# Patient Record
Sex: Female | Born: 1990 | Race: White | Hispanic: No | Marital: Single | Smoking: Never smoker
Health system: Southern US, Community
[De-identification: ages and names within clinical notes are randomized; demographics above are authoritative.]

## PROBLEM LIST (undated history)

## (undated) ENCOUNTER — Inpatient Hospital Stay (HOSPITAL_COMMUNITY): Payer: Self-pay

## (undated) DIAGNOSIS — F419 Anxiety disorder, unspecified: Secondary | ICD-10-CM

## (undated) DIAGNOSIS — O139 Gestational [pregnancy-induced] hypertension without significant proteinuria, unspecified trimester: Secondary | ICD-10-CM

## (undated) DIAGNOSIS — F32A Depression, unspecified: Secondary | ICD-10-CM

## (undated) DIAGNOSIS — F329 Major depressive disorder, single episode, unspecified: Secondary | ICD-10-CM

## (undated) DIAGNOSIS — Z87448 Personal history of other diseases of urinary system: Secondary | ICD-10-CM

## (undated) DIAGNOSIS — G43909 Migraine, unspecified, not intractable, without status migrainosus: Secondary | ICD-10-CM

## (undated) DIAGNOSIS — F988 Other specified behavioral and emotional disorders with onset usually occurring in childhood and adolescence: Secondary | ICD-10-CM

## (undated) DIAGNOSIS — E063 Autoimmune thyroiditis: Secondary | ICD-10-CM

## (undated) HISTORY — DX: Migraine, unspecified, not intractable, without status migrainosus: G43.909

## (undated) HISTORY — DX: Major depressive disorder, single episode, unspecified: F32.9

## (undated) HISTORY — DX: Depression, unspecified: F32.A

## (undated) HISTORY — PX: WISDOM TOOTH EXTRACTION: SHX21

## (undated) HISTORY — DX: Personal history of other diseases of urinary system: Z87.448

## (undated) HISTORY — DX: Gestational (pregnancy-induced) hypertension without significant proteinuria, unspecified trimester: O13.9

## (undated) HISTORY — DX: Anxiety disorder, unspecified: F41.9

## (undated) HISTORY — PX: TYMPANOSTOMY TUBE PLACEMENT: SHX32

## (undated) HISTORY — PX: TONSILLECTOMY AND ADENOIDECTOMY: SHX28

## (undated) HISTORY — DX: Other specified behavioral and emotional disorders with onset usually occurring in childhood and adolescence: F98.8

---
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
## 2012-07-25 ENCOUNTER — Ambulatory Visit: Payer: Self-pay | Admitting: Obstetrics and Gynecology

## 2012-07-25 ENCOUNTER — Encounter: Payer: Self-pay | Admitting: Obstetrics and Gynecology

## 2012-07-25 VITALS — BP 128/86 | Ht 70.0 in | Wt 236.0 lb

## 2012-07-25 DIAGNOSIS — N6489 Other specified disorders of breast: Secondary | ICD-10-CM

## 2012-07-25 NOTE — Progress Notes (Signed)
Amanda Phelps  is a 21 y.o. female who presents for evaluate for changes in breast size.    HPI: Amanda Phelps is a 21 yo who evaluate for breast size.  She is very self-conscious about the asymmetry of her breasts.  She would like to see what options are available for treatment.  She is otherwise UTD on her annual health care.    OB History    Grav Para Term Preterm Abortions TAB SAB Ect Mult Living    0 0 0 0 0 0 0 0 0 0        Obstetric Comments    Menarche: 21 yo; Coitarche: 21 yo  LMP 06/30/2012.   Menses:  regular every 28-30 days  Bleeding: 5-6  Cramps: None  Patient is sexually active.   Safe Sex:  yes   STD Hx: none  Contraception: condoms: 100% and OCP  Pap smear: never          Screening History:  Immunizations UTD:  yes.  Gardasil: yes.  Lipids: N/A  Glucose: N/A  Thyroid Screening:  N/A    Past Medical History   Diagnosis Date   . ADD (attention deficit disorder)      not formerly diagnosed   . Anxiety disorder        Past Surgical History   Procedure Laterality Date   . Tonsillectomy and adenoidectomy         Family History   Problem Relation Age of Onset   . Cancer Maternal Grandfather      Lung cancer, smoker   . Heart failure Maternal Grandmother    . Heart failure Maternal Grandfather    . Osteoporosis Maternal Aunt    . Thyroid disease Maternal Grandfather    . Diabetes Maternal Grandmother    . Depression Maternal Aunt        HEALTH HABITS:    Guns in the home: No  Wear a helmet when riding a bike:  Yes 100%  Wears seat belt in the car: yes  Internet safety:  yes  Phone safety (texting and driving):  no  The patient participates in regular exercise: yes  Type:  Work out 3-5 times per week  Has the patient ever been transfused or tattooed?:yes  Self Breast Awareness:  yes  Vitamin D3: yes  Three servings of calcium per day:   yes  Balanced Diet:  yes  Dental visit in last 6 months:  yes  Eye exams yearly:  yes  Sunscreen use when in sun > 20 min:  yes  Health Care Proxy:  yes    Allergies   Allergen  Reactions   . Bactrim (Sulfamethoxazole W-Trimethoprim) Rash   . Zithromax (Azithromycin) Rash        Current Outpatient Prescriptions   Medication   . amphetamine-dextroamphetamine (ADDERALL) 15 MG tablet   . Norgestim-Eth Estrad Triphasic (ORTHO TRI-CYCLEN, 28, PO)     No current facility-administered medications for this visit.        History     Social History   . Marital Status: Single     Spouse Name: N/A     Number of Children: N/A   . Years of Education: N/A     Occupational History   . Not on file.     Social History Main Topics   . Smoking status: Never Smoker    . Smokeless tobacco: Never Used   . Alcohol Use: Yes      Comment: socially    .  Drug Use: No   . Sexually Active: Yes -- Female partner(s)     Birth Control/ Protection: Condom, OCP     Other Topics Concern   . Not on file     Social History Narrative    Psychosocial Evaluation:        Lives with roommate and parents    Abuse: No    Depression symptoms:  no    School:  [employer] [place]    Patient is doing well in school:  yes    Grades:  [employer]    Behavioral or learning problems:  no    Peer relationships:  Good friends and boyfriend treats her well.    Family relationships:  Good    Future goals:  Marketing rep                  ROS:  CONSTITUTIONAL: Appetite good, no fevers, night sweats or weight loss  EYES: No visual changes, no eye pain  ENT: No hearing difficulties, no ear pain  CV: No chest pain, shortness of breath or peripheral edema  RESPIRATORY: No cough, wheezing or dyspnea  GI: No nausea/vomiting, abdominal pain, or change in bowel habits  GU: No dysuria, urgency or incontinence  MS: No joint pain/swelling or musculoskeletal deformities  SKIN: No rashes  NEURO: No MS changes, no motor weakness, no sensory changes  PSYCH: No depression or anxiety  ENDOCRINE: No polyuria/polydipsia, no heat intolerance  HEME/LYMPH: No easy bleeding/bruising or swollen nodes  ALL/IMMUN: No allergic reactions     OBJECTIVE:  BP 128/86  Ht 5\' 10"   (1.778 m)  Wt 236 lb (107.049 kg)  BMI 33.86 kg/m2  LMP 06/30/2012   Physical Exam:  GENERAL: 21 y.o.  year y/o female in NAD.  HEENT: EOMI  LUNGS: respirations unlabored.  HEART: Regular rate  BREASTS: No palpable masses or nipple discharge. No skin changes. No axillary or clavicular adenopathy.  Left breast is larger than the right breast.  Tanner stage:  5  PELVIC: Not indicated  EXTREMITIES: no edema.  SKIN: No rashes, no evidence of skin breakdown, no suspicious nevi.  MENTAL STATUS: Alert, normal MS. Answers all questions appropriately.    Assessment:  Breast Asymetry  Plan:  GYN SCREENING:      []   Pap smear sent      []   High Risk HPV sent     [x]   Pap smear starting at age 75     []   GC     []  Chlamydia     []   HIV     []   RPR     []   HSV 2 serology      []   Hepatitis C      []   Hepatitis B surface antibody and antigen     [x]   STI screening with sexual activity     PREVENTATIVE HEALTH:     []   Healthy women's lifestyle handout      [x]   Self breast awareness      [x]   Nutrition      [x]   Calcium and Vitamin D3     [x]   Multivitamins      [x]   Screening blood work up to date     []   Screening blood work ordered:  []   Lipids []   A1C []   Thyroid Function Tests    SMOKING CESSATION:       [x]   N/A      []   Risks reviewed      []   Offered nicotine replacement []   Buspar []   Chantix []   Zyban    SAFE SEX PRACTICES:  Reviewed    VACCINATIONS: UTD      Breast asymmetry.  Discussed that other than padding the right breast, surgery would be her most permanent option.  She could elect for reduction on the left vs augmentation on the right.  Discussed that if it is a cosmetic surgery, then insurance may not cover the procedure.  Discussed that no medical problem appears to be present or the cause of the asymmetry.    RETURN TO CLINIC:  As needed.

## 2013-01-19 ENCOUNTER — Emergency Department (INDEPENDENT_AMBULATORY_CARE_PROVIDER_SITE_OTHER)
Admission: EM | Admit: 2013-01-19 | Discharge: 2013-01-19 | Disposition: A | Discharge status: Home / Self Care | Payer: 59 | Source: Home / Self Care | Attending: Emergency Medicine | Admitting: Emergency Medicine

## 2013-01-19 ENCOUNTER — Encounter (HOSPITAL_COMMUNITY): Payer: Self-pay | Admitting: *Deleted

## 2013-01-19 DIAGNOSIS — M549 Dorsalgia, unspecified: Secondary | ICD-10-CM

## 2013-01-19 LAB — POCT URINALYSIS DIP (DEVICE)
Hgb urine dipstick: NEGATIVE
Protein, ur: 30 mg/dL — AB
Specific Gravity, Urine: >=1.03 (ref 1.005–1.030)
Urobilinogen, UA: 0.2 mg/dL (ref 0.0–1.0)

## 2013-01-19 LAB — POCT PREGNANCY, URINE: Preg Test, Ur: NEGATIVE

## 2013-01-19 MED ORDER — CYCLOBENZAPRINE HCL 10 MG PO TABS: 10.0000 mg | ORAL_TABLET | Freq: Two times a day (BID) | ORAL | Status: DC | PRN

## 2013-01-19 MED ORDER — HYDROCODONE-IBUPROFEN 7.5-200 MG PO TABS: 1.0000 | ORAL_TABLET | Freq: Three times a day (TID) | ORAL | Status: DC | PRN

## 2013-01-19 NOTE — ED Provider Notes (Signed)
History     CSN: [id number]  Arrival date & time 01/19/13  1647   First MD Initiated Contact with Patient 01/19/13 1707      Chief Complaint  Patient presents with  . Back Pain    (Consider location/radiation/quality/duration/timing/severity/associated sxs/prior treatment) HPI Comments: Patient presents urgent care complaining of lower back pain for the last 2-3 days. Describes the pain as told and sharp with movements and it worsens when she lays flat. If she stays still she feels significant improvement. Patient denies any further associated symptoms such as urinary symptoms that includes decrease frequency pressure burning with urination. Patient also denies any pelvic or abdominal pain. No fevers. Patient denies any lower extremity weakness or paresthesias. Denies any recent trauma or injury. No fevers. Describe some ibuprofen but did not help much. She decided to come in and somebody told her that she could be having a kidney infection. Decided to come in to be checked and somebody mentioned that she could be expressing a kidney infection. She denies any urinary symptoms or pelvic pain or flank pain nausea vomiting or fevers,   Patient is a 22 y.o. female presenting with back pain. The history is provided by the patient.  Back Pain Location:  Lumbar spine Quality:  Aching Radiates to:  Does not radiate Pain severity:  Moderate Pain is:  Same all the time Onset quality:  Gradual Duration:  3 days Timing:  Constant Chronicity:  New Context: not lifting heavy objects, not recent illness and not recent injury   Relieved by:  Nothing Worsened by:  Movement, standing and ambulation Ineffective treatments:  Ibuprofen Associated symptoms: no abdominal pain, no abdominal swelling, no bladder incontinence, no bowel incontinence, no dysuria, no fever, no numbness, no paresthesias, no pelvic pain, no perianal numbness, no tingling and no weakness   Risk factors: no recent surgery      History reviewed. No pertinent past medical history.  History reviewed. No pertinent past surgical history.  History reviewed. No pertinent family history.  History  Substance Use Topics  . Smoking status: Never Smoker   . Smokeless tobacco: Not on file  . Alcohol Use: Yes    OB History   Grav Para Term Preterm Abortions TAB SAB Ect Mult Living                  Review of Systems  Constitutional: Negative for fever, chills, diaphoresis and appetite change.  Gastrointestinal: Negative for abdominal pain and bowel incontinence.  Genitourinary: Negative for bladder incontinence, dysuria, frequency, flank pain, difficulty urinating and pelvic pain.  Musculoskeletal: Positive for back pain. Negative for myalgias, joint swelling and arthralgias.  Skin: Negative for color change, pallor and rash.  Neurological: Negative for tingling, weakness, numbness and paresthesias.    Allergies  Bactrim and Zithromax  Home Medications   Current Outpatient Rx  Name  Route  Sig  Dispense  Refill  . cyclobenzaprine (FLEXERIL) 10 MG tablet   Oral   Take 1 tablet (10 mg total) by mouth 2 (two) times daily as needed for muscle spasms.   10 tablet   0   . HYDROcodone-ibuprofen (VICOPROFEN) 7.5-200 MG per tablet   Oral   Take 1 tablet by mouth every 8 (eight) hours as needed for pain.   15 tablet   0     BP 126/83  Pulse 95  Temp(Src) 97.9 F (36.6 C) (Oral)  Resp 18  SpO2 98%  LMP 01/11/2013  Physical Exam  Nursing note and  vitals reviewed. Constitutional: She appears well-developed and well-nourished.  Pulmonary/Chest: Effort normal and breath sounds normal.  Abdominal: Soft.  Musculoskeletal: She exhibits tenderness.       Lumbar back: She exhibits decreased range of motion, tenderness and pain. She exhibits no bony tenderness, no swelling, no edema, no deformity, no laceration, no spasm and normal pulse.       Back:  Neurological: She is alert.  Skin: No rash noted.  No erythema.    ED Course  Procedures (including critical care time)  Labs Reviewed  POCT URINALYSIS DIP (DEVICE) - Abnormal; Notable for the following:    Bilirubin Urine SMALL (*)    Protein, ur 30 (*)    All other components within normal limits  POCT PREGNANCY, URINE   No results found.   1. Back pain       MDM  Symptoms and exam is most suggestive of paravertebral lumbar muscle pain. Rx for Vicoprofen and Flexeril- discussed symptoms that should warrant further evaluation.        Jimmie Molly, MD 01/19/13 [phone]

## 2013-01-19 NOTE — ED Notes (Signed)
Pt  Reports  Low  Back  Pain  X  Several  Days  Worse  On  Movement  And  posistion      Denys  Any  Urinary  Symptoms       denys  Any  specefic   Injury   Walks  Upright  With a  Brisk  Gait

## 2013-06-23 ENCOUNTER — Encounter (HOSPITAL_COMMUNITY): Payer: Self-pay | Admitting: Emergency Medicine

## 2013-06-23 ENCOUNTER — Emergency Department (INDEPENDENT_AMBULATORY_CARE_PROVIDER_SITE_OTHER)
Admission: EM | Admit: 2013-06-23 | Discharge: 2013-06-23 | Disposition: A | Discharge status: Home / Self Care | Payer: PRIVATE HEALTH INSURANCE | Source: Home / Self Care | Attending: Family Medicine | Admitting: Family Medicine

## 2013-06-23 DIAGNOSIS — S069X0A Unspecified intracranial injury without loss of consciousness, initial encounter: Secondary | ICD-10-CM

## 2013-06-23 DIAGNOSIS — S069X9A Unspecified intracranial injury with loss of consciousness of unspecified duration, initial encounter: Secondary | ICD-10-CM

## 2013-06-23 MED ORDER — ONDANSETRON 4 MG PO TBDP: 4.0000 mg | ORAL_TABLET | Freq: Four times a day (QID) | ORAL | Status: DC | PRN

## 2013-06-23 MED ORDER — TRAMADOL HCL 50 MG PO TABS: 50.0000 mg | ORAL_TABLET | Freq: Four times a day (QID) | ORAL | Status: DC | PRN

## 2013-06-23 MED ORDER — BUTALBITAL-APAP-CAFFEINE 50-325-40 MG PO TABS: 1.0000 | ORAL_TABLET | Freq: Four times a day (QID) | ORAL | Status: AC | PRN

## 2013-06-23 NOTE — ED Provider Notes (Signed)
Medical screening examination/treatment/procedure(s) were performed by resident physician or non-physician practitioner and as supervising physician I was immediately available for consultation/collaboration.   KINDL,JAMES DOUGLAS MD.   James D Kindl, MD 06/23/13 1701 

## 2013-06-23 NOTE — ED Provider Notes (Signed)
CSN: [id number]     Arrival date & time 06/23/13  1325 History   First MD Initiated Contact with Patient 06/23/13 1534     Chief Complaint  Patient presents with  . Headache   (Consider location/radiation/quality/duration/timing/severity/associated sxs/prior Treatment) HPI Comments: 22 year old female presents complaining of left-sided headache since yesterday. Yesterday, she rolled off the side of her bed and hit the left side of her head on her nightstand. She had immediate pain in the left side of her head. Since then, she has developed some dizziness and nausea. She has not actually vomited. She did not lose consciousness. Headache has not been increasing since it began.  Patient is a 22 y.o. female presenting with headaches.  Headache Associated symptoms: dizziness, nausea and photophobia   Associated symptoms: no abdominal pain, no cough, no fever, no myalgias and no vomiting     History reviewed. No pertinent past medical history. History reviewed. No pertinent past surgical history. No family history on file. History  Substance Use Topics  . Smoking status: Never Smoker   . Smokeless tobacco: Not on file  . Alcohol Use: Yes   OB History   Grav Para Term Preterm Abortions TAB SAB Ect Mult Living                 Review of Systems  Constitutional: Negative for fever and chills.  Eyes: Positive for photophobia and visual disturbance.  Respiratory: Negative for cough and shortness of breath.   Cardiovascular: Negative for chest pain, palpitations and leg swelling.  Gastrointestinal: Positive for nausea. Negative for vomiting and abdominal pain.  Endocrine: Negative for polydipsia and polyuria.  Genitourinary: Negative for dysuria, urgency and frequency.  Musculoskeletal: Negative for arthralgias and myalgias.  Skin: Negative for rash.  Neurological: Positive for dizziness and headaches. Negative for weakness and light-headedness.    Allergies  Bactrim and  Zithromax  Home Medications   Current Outpatient Rx  Name  Route  Sig  Dispense  Refill  . butalbital-acetaminophen-caffeine (FIORICET) 50-325-40 MG per tablet   Oral   Take 1-2 tablets by mouth every 6 (six) hours as needed for headache.   20 tablet   0   . cyclobenzaprine (FLEXERIL) 10 MG tablet   Oral   Take 1 tablet (10 mg total) by mouth 2 (two) times daily as needed for muscle spasms.   10 tablet   0   . HYDROcodone-ibuprofen (VICOPROFEN) 7.5-200 MG per tablet   Oral   Take 1 tablet by mouth every 8 (eight) hours as needed for pain.   15 tablet   0   . ondansetron (ZOFRAN-ODT) 4 MG disintegrating tablet   Oral   Take 1 tablet (4 mg total) by mouth every 6 (six) hours as needed for nausea. PRN for nausea or vomiting   12 tablet   0   . traMADol (ULTRAM) 50 MG tablet   Oral   Take 1 tablet (50 mg total) by mouth every 6 (six) hours as needed for severe pain.   15 tablet   0    BP 137/95  Pulse 95  Temp(Src) 98.6 F (37 C) (Oral)  Resp 18  SpO2 97%  LMP 05/30/2013 Physical Exam  Nursing note and vitals reviewed. Constitutional: She is oriented to person, place, and time. Vital signs are normal. She appears well-developed and well-nourished. No distress.  HENT:  Head: Normocephalic and atraumatic.  Eyes: Conjunctivae and EOM are normal. Pupils are equal, round, and reactive to light.  Neck: Normal  range of motion. Neck supple. No JVD present.  Pulmonary/Chest: Effort normal. No respiratory distress.  Neurological: She is alert and oriented to person, place, and time. She has normal strength. She displays normal reflexes. No cranial nerve deficit or sensory deficit. She exhibits normal muscle tone. Coordination and gait normal.  Skin: Skin is warm and dry. No rash noted. She is not diaphoretic.  Psychiatric: She has a normal mood and affect. Judgment normal.    ED Course  Procedures (including critical care time) Labs Review Labs Reviewed - No data to  display Imaging Review No results found.    MDM   1. TBI (traumatic brain injury), initial encounter    Neuro exam is normal. Symptoms are not worsening, this is a concussion. Treating symptomatically. Discussed return to full activity protocol. Followup if worsening in the emergency department.       Graylon Good, PA-C 06/23/13 1616

## 2013-06-23 NOTE — ED Notes (Signed)
Pt c/o headache onset yest night on left side Reports she was playing and rolled off her bed on to the corner of the wooden nightstand Has a small bump on left side and it hurts... Pain is constant and increases w/bright light... Also feeling nauseas and having blurry vision Denies: LOC, abn bleeding Alert w/no signs of acute distress.

## 2014-08-28 ENCOUNTER — Emergency Department (HOSPITAL_COMMUNITY)
Admission: EM | Admit: 2014-08-28 | Discharge: 2014-08-29 | Disposition: A | Discharge status: Home / Self Care | Payer: Medicaid - Out of State | Attending: Emergency Medicine | Admitting: Emergency Medicine

## 2014-08-28 ENCOUNTER — Encounter (HOSPITAL_COMMUNITY): Payer: Self-pay | Admitting: Emergency Medicine

## 2014-08-28 DIAGNOSIS — K219 Gastro-esophageal reflux disease without esophagitis: Secondary | ICD-10-CM | POA: Insufficient documentation

## 2014-08-28 DIAGNOSIS — K852 Alcohol induced acute pancreatitis without necrosis or infection: Secondary | ICD-10-CM

## 2014-08-28 DIAGNOSIS — R1012 Left upper quadrant pain: Secondary | ICD-10-CM

## 2014-08-28 DIAGNOSIS — Z3202 Encounter for pregnancy test, result negative: Secondary | ICD-10-CM | POA: Diagnosis not present

## 2014-08-28 LAB — COMPREHENSIVE METABOLIC PANEL
ALT: 31 U/L (ref 0–35)
ANION GAP: 8 (ref 5–15)
AST: 28 U/L (ref 0–37)
Albumin: 4.2 g/dL (ref 3.5–5.2)
Alkaline Phosphatase: 62 U/L (ref 39–117)
BILIRUBIN TOTAL: 0.5 mg/dL (ref 0.3–1.2)
BUN: 8 mg/dL (ref 6–23)
CO2: 24 mmol/L (ref 19–32)
Calcium: 8.9 mg/dL (ref 8.4–10.5)
Chloride: 105 mmol/L (ref 96–112)
Creatinine, Ser: 0.59 mg/dL (ref 0.50–1.10)
GFR calc Af Amer: >90 mL/min (ref >90)
GFR calc non Af Amer: >90 mL/min (ref >90)
GLUCOSE: 114 mg/dL — AB (ref 70–99)
Potassium: 3.5 mmol/L (ref 3.5–5.1)
Sodium: 137 mmol/L (ref 135–145)
Total Protein: 7.4 g/dL (ref 6.0–8.3)

## 2014-08-28 LAB — URINALYSIS, ROUTINE W REFLEX MICROSCOPIC
Bilirubin Urine: NEGATIVE
GLUCOSE, UA: NEGATIVE mg/dL
Hgb urine dipstick: NEGATIVE
Ketones, ur: NEGATIVE mg/dL
LEUKOCYTES UA: NEGATIVE
NITRITE: NEGATIVE
PH: 6.5 (ref 5.0–8.0)
Protein, ur: NEGATIVE mg/dL
SPECIFIC GRAVITY, URINE: 1.006 (ref 1.005–1.030)
Urobilinogen, UA: 0.2 mg/dL (ref 0.0–1.0)

## 2014-08-28 LAB — CBC WITH DIFFERENTIAL/PLATELET
BASOS ABS: 0 10*3/uL (ref 0.0–0.1)
BASOS PCT: 0 % (ref 0–1)
Eosinophils Absolute: 0.1 10*3/uL (ref 0.0–0.7)
Eosinophils Relative: 1 % (ref 0–5)
HEMATOCRIT: 38.6 % (ref 36.0–46.0)
HEMOGLOBIN: 13.4 g/dL (ref 12.0–15.0)
Lymphocytes Relative: 26 % (ref 12–46)
Lymphs Abs: 2.4 10*3/uL (ref 0.7–4.0)
MCH: 30.5 pg (ref 26.0–34.0)
MCHC: 34.7 g/dL (ref 30.0–36.0)
MCV: 87.7 fL (ref 78.0–100.0)
Monocytes Absolute: 0.7 10*3/uL (ref 0.1–1.0)
Monocytes Relative: 8 % (ref 3–12)
Neutro Abs: 5.9 10*3/uL (ref 1.7–7.7)
Neutrophils Relative %: 65 % (ref 43–77)
PLATELETS: 236 10*3/uL (ref 150–400)
RBC: 4.4 MIL/uL (ref 3.87–5.11)
RDW: 12.7 % (ref 11.5–15.5)
WBC: 9.1 10*3/uL (ref 4.0–10.5)

## 2014-08-28 LAB — LIPASE, BLOOD: Lipase: 67 U/L — ABNORMAL HIGH (ref 11–59)

## 2014-08-28 LAB — POC URINE PREG, ED: Preg Test, Ur: NEGATIVE

## 2014-08-28 MED ORDER — SODIUM CHLORIDE 0.9 % IV BOLUS (SEPSIS)
1000.0000 mL | Freq: Once | INTRAVENOUS | Status: AC
  Administered 2014-08-28: 1000 mL via INTRAVENOUS

## 2014-08-28 MED ORDER — GI COCKTAIL ~~LOC~~
30.0000 mL | Freq: Once | ORAL | Status: AC
  Administered 2014-08-28: 30 mL via ORAL
  Filled 2014-08-28: qty 30

## 2014-08-28 MED ORDER — PANTOPRAZOLE SODIUM 40 MG IV SOLR
40.0000 mg | Freq: Once | INTRAVENOUS | Status: AC
  Administered 2014-08-28: 40 mg via INTRAVENOUS
  Filled 2014-08-28: qty 40

## 2014-08-28 NOTE — ED Provider Notes (Signed)
CSN: [id number]     Arrival date & time 08/28/14  1759 History   First MD Initiated Contact with Patient 08/28/14 2231     Chief Complaint  Patient presents with  . Flank Pain    pain in upper l/abdomin radiating to l/back     (Consider location/radiation/quality/duration/timing/severity/associated sxs/prior Treatment) HPI Comments: Beth English is a 24 y.o. female with no significant PMHx, who presents to the ED with complaints of LUQ/L lateral abdominal pain that began gradually on Saturday after consuming a large amount of alcohol on Friday night and eating "junk food". Patient reports that during the snowstorm, she and her friends consumed 2 bottles of wine, 2 beers, and 2 gin-and-juice cocktails, and on Saturday morning she developed 8/10 sharp constant nonradiating pain located in the left upper quadrant and lateral aspect of the abdomen, pointing to the mid axillary line at the bottom of the rib cage, which worsens with stretching and movement, and is unrelieved with heat and ibuprofen. She endorses associated reflux and belching. She denies any fevers, chills, chest pain, shortness of breath, nausea, vomiting, diarrhea, constipation, melena, hematochezia, obstipation, dysuria, hematuria, vaginal bleeding or discharge, recent travel, antibiotics, or sick contacts. She does endorse taking NSAIDs quite frequently. She is sexually active with one female partner, unprotected. Last menstrual period was 07/26/14, and she is currently in between birth control pills and therefore her period is irregular this month.  Patient is a 24 y.o. female presenting with abdominal pain. The history is provided by the patient. No language interpreter was used.  Abdominal Pain Pain location:  LUQ Pain quality: sharp   Pain radiates to:  Does not radiate Pain severity:  Moderate (8/10) Onset quality:  Gradual Duration:  4 days Timing:  Constant Progression:  Unchanged Chronicity:  New Context: alcohol use     Relieved by:  Nothing Worsened by:  Movement Ineffective treatments:  Heat and NSAIDs Associated symptoms: belching   Associated symptoms: no chest pain, no chills, no constipation, no diarrhea, no dysuria, no fever, no flatus, no hematemesis, no hematochezia, no hematuria, no melena, no nausea, no shortness of breath, no vaginal bleeding, no vaginal discharge and no vomiting   Risk factors: NSAID use     History reviewed. No pertinent past medical history. Past Surgical History  Procedure Laterality Date  . Tonsillectomy    . Tympanostomy tube placement    . Wisdom tooth extraction     Family History  Problem Relation Age of Onset  . Hypertension Mother   . Diabetes Other    History  Substance Use Topics  . Smoking status: Never Smoker   . Smokeless tobacco: Not on file  . Alcohol Use: Yes   OB History    No data available     Review of Systems  Constitutional: Negative for fever and chills.  Respiratory: Negative for shortness of breath.   Cardiovascular: Negative for chest pain.  Gastrointestinal: Positive for abdominal pain. Negative for nausea, vomiting, diarrhea, constipation, blood in stool, melena, hematochezia, flatus and hematemesis.       +reflux  Genitourinary: Positive for flank pain. Negative for dysuria, urgency, hematuria, vaginal bleeding, vaginal discharge and menstrual problem.  Musculoskeletal: Negative for myalgias, back pain and arthralgias.  Skin: Negative for color change.  Allergic/Immunologic: Negative for immunocompromised state.  Neurological: Negative for weakness and numbness.   10 Systems reviewed and are negative for acute change except as noted in the HPI.    Allergies  Bactrim and Zithromax  Home  Medications   Prior to Admission medications   Medication Sig Start Date End Date Taking? Authorizing Provider  cyclobenzaprine (FLEXERIL) 10 MG tablet Take 1 tablet (10 mg total) by mouth 2 (two) times daily as needed for muscle spasms.  01/19/13   Jimmie Molly, MD  HYDROcodone-ibuprofen (VICOPROFEN) 7.5-200 MG per tablet Take 1 tablet by mouth every 8 (eight) hours as needed for pain. 01/19/13   Jimmie Molly, MD  ondansetron (ZOFRAN-ODT) 4 MG disintegrating tablet Take 1 tablet (4 mg total) by mouth every 6 (six) hours as needed for nausea. PRN for nausea or vomiting 06/23/13   Graylon Good, PA-C  traMADol (ULTRAM) 50 MG tablet Take 1 tablet (50 mg total) by mouth every 6 (six) hours as needed for severe pain. 06/23/13   Adrian Blackwater Baker, PA-C   BP 125/89 mmHg  Pulse 90  Temp(Src) 98.2 F (36.8 C) (Oral)  Resp 18  Wt 245 lb (111.131 kg)  SpO2 100%  LMP 07/26/2014 (Exact Date) Physical Exam  Constitutional: She is oriented to person, place, and time. Vital signs are normal. She appears well-developed and well-nourished.  Non-toxic appearance. No distress.  Afebrile, nontoxic, NAD  HENT:  Head: Normocephalic and atraumatic.  Mouth/Throat: Oropharynx is clear and moist and mucous membranes are normal.  Eyes: Conjunctivae and EOM are normal. Right eye exhibits no discharge. Left eye exhibits no discharge.  Neck: Normal range of motion. Neck supple.  Cardiovascular: Normal rate, regular rhythm, normal heart sounds and intact distal pulses.  Exam reveals no gallop and no friction rub.   No murmur heard. Pulmonary/Chest: Effort normal and breath sounds normal. No respiratory distress. She has no decreased breath sounds. She has no wheezes. She has no rhonchi. She has no rales.  Abdominal: Soft. Normal appearance and bowel sounds are normal. She exhibits no distension. There is tenderness in the left upper quadrant. There is no rigidity, no rebound, no guarding, no CVA tenderness, no tenderness at McBurney's point and negative Murphy's sign.    Soft, ND, +BS throughout, with LUQ/L lateral abd tenderness extending along inferior border of rib cage around to mid-axillary line, no r/g/r, neg murphy's, neg mcburney's, no CVA TTP     Musculoskeletal: Normal range of motion.  Neurological: She is alert and oriented to person, place, and time. She has normal strength. No sensory deficit.  Skin: Skin is warm, dry and intact. No rash noted.  Psychiatric: She has a normal mood and affect.  Nursing note and vitals reviewed.   ED Course  Procedures (including critical care time) Labs Review Labs Reviewed  URINALYSIS, ROUTINE W REFLEX MICROSCOPIC - Abnormal; Notable for the following:    APPearance CLOUDY (*)    All other components within normal limits  COMPREHENSIVE METABOLIC PANEL - Abnormal; Notable for the following:    Glucose, Bld 114 (*)    All other components within normal limits  LIPASE, BLOOD - Abnormal; Notable for the following:    Lipase 67 (*)    All other components within normal limits  CBC WITH DIFFERENTIAL/PLATELET  POC URINE PREG, ED    Imaging Review No results found.   EKG Interpretation None      MDM   Final diagnoses:  LUQ abdominal pain  Alcohol-induced acute pancreatitis  Gastroesophageal reflux disease, esophagitis presence not specified    24 y.o. female with LUQ/L lateral abd pain after consuming alcohol 4 days ago. +NSAID use. +reflux and belching. Nonperitoneal abd exam, doubt need for imaging. Upreg neg. U/A clear.  Will get labs, give protonix and GI cocktail, and fluids, and reassess. Will consider morphine if pain persists but pt is driving therefore would like to avoid narcotics here. DDx includes gastritis vs pancreatitis. Will reassess shortly.   12:37 AM  CBC WNL. CMP WNL. Lipase 67, which is likely from pancreatitis that is resolving since onset of symptoms was 4 days ago and level is almost WNL. Pt without any LFT abnormalities or murphy's, doubt need for RUQ u/s today since this was likely related to EtOH consumption. Reflux symptom improved after GI cocktail, but pain still somewhat present. Discussed morphine, which she initially agreed to but then realized her  phone was almost out of battery and stated she preferred to be discharged home with pain medicine and she will fill it and take when she gets home. Will give her norco and prilosec. Will have her avoid EtOH and NSAIDs. Discussed f/up with campus clinic in 2 days for recheck. Discussed diet modifications to help avoid any worsening. I explained the diagnosis and have given explicit precautions to return to the ER including for any other new or worsening symptoms. The patient understands and accepts the medical plan as it's been dictated and I have answered their questions. Discharge instructions concerning home care and prescriptions have been given. The patient is STABLE and is discharged to home in good condition.  BP 129/72 mmHg  Pulse 82  Temp(Src) 98.1 F (36.7 C) (Oral)  Resp 18  Wt 245 lb (111.131 kg)  SpO2 100%  LMP 07/26/2014 (Exact Date)  Meds ordered this encounter  Medications  . gi cocktail (Maalox,Lidocaine,Donnatal)    Sig:   . pantoprazole (PROTONIX) injection 40 mg    Sig:   . sodium chloride 0.9 % bolus 1,000 mL    Sig:   . DISCONTD: morphine 4 MG/ML injection 4 mg    Sig:   . omeprazole (PRILOSEC) 20 MG capsule    Sig: Take 1 capsule (20 mg total) by mouth daily.    Dispense:  30 capsule    Refill:  0  . HYDROcodone-acetaminophen (NORCO) 5-325 MG per tablet    Sig: Take 1 tablet by mouth every 6 (six) hours as needed for severe pain.    Dispense:  10 tablet    Refill:  [address]     Theresia Pree Strupp Jermynamprubi-Soms, PA-C 08/29/14 0044  Linwood DibblesJon Knapp, MD 08/30/14 [phone]

## 2014-08-28 NOTE — ED Notes (Signed)
Pt reports a 4 days hx of increased l/flank pain. Denies burning on urination or discolored urine.Denies NVD

## 2014-08-29 MED ORDER — OMEPRAZOLE 20 MG PO CPDR: 20.0000 mg | DELAYED_RELEASE_CAPSULE | Freq: Every day | ORAL | Status: DC

## 2014-08-29 MED ORDER — HYDROCODONE-ACETAMINOPHEN 5-325 MG PO TABS: 1.0000 | ORAL_TABLET | Freq: Four times a day (QID) | ORAL | Status: DC | PRN

## 2014-08-29 MED ORDER — MORPHINE SULFATE 4 MG/ML IJ SOLN: 4.0000 mg | Freq: Once | INTRAMUSCULAR | Status: DC

## 2014-08-29 NOTE — Discharge Instructions (Signed)
Your symptoms are likely from alcohol induced pancreatitis, which seems to be resolving. You could have also caused some gastritis from the alcohol. Use prilosec as directed to help protect your stomach and help soothe your symptoms. Use norco as directed as needed for pain. Don't drive while taking norco. Avoid alcohol or ibuprofen/NSAID use to help avoid worsening of symptoms. Follow the diet modifications discussed below to help with your symptoms, sticking to a bland low fat diet. See your campus clinic in 2 days for recheck. Return to the ER for changes or worsening symptoms.  Abdominal (belly) pain can be caused by many things. Your caregiver performed an examination and possibly ordered blood/urine tests and imaging (CT scan, x-rays, ultrasound). Many cases can be observed and treated at home after initial evaluation in the emergency department. Even though you are being discharged home, abdominal pain can be unpredictable. Therefore, you need a repeated exam if your pain does not resolve, returns, or worsens. Most patients with abdominal pain don't have to be admitted to the hospital or have surgery, but serious problems like appendicitis and gallbladder attacks can start out as nonspecific pain. Many abdominal conditions cannot be diagnosed in one visit, so follow-up evaluations are very important. SEEK IMMEDIATE MEDICAL ATTENTION IF YOU DEVELOP ANY OF THE FOLLOWING SYMPTOMS:  The pain does not go away or becomes severe.   A temperature above 101 develops.   Repeated vomiting occurs (multiple episodes).   The pain becomes localized to portions of the abdomen. The right side could possibly be appendicitis. In an adult, the left lower portion of the abdomen could be colitis or diverticulitis.   Blood is being passed in stools or vomit (bright red or black tarry stools).   Return also if you develop chest pain, difficulty breathing, dizziness or fainting, or become confused, poorly responsive,  or inconsolable (young children).  The constipation stays for more than 4 days.   There is belly (abdominal) or rectal pain.   You do not seem to be getting better.     Acute Pancreatitis Acute pancreatitis is a disease in which the pancreas becomes suddenly inflamed. The pancreas is a large gland located behind your stomach. The pancreas produces enzymes that help digest food. The pancreas also releases the hormones glucagon and insulin that help regulate blood sugar. Damage to the pancreas occurs when the digestive enzymes from the pancreas are activated and begin attacking the pancreas before being released into the intestine. Most acute attacks last a couple of days and can cause serious complications. Some people become dehydrated and develop low blood pressure. In severe cases, bleeding into the pancreas can lead to shock and can be life-threatening. The lungs, heart, and kidneys may fail. CAUSES  Pancreatitis can happen to anyone. In some cases, the cause is unknown. Most cases are caused by:  Alcohol abuse.  Gallstones. Other less common causes are:  Certain medicines.  Exposure to certain chemicals.  Infection.  Damage caused by an accident (trauma).  Abdominal surgery. SYMPTOMS   Pain in the upper abdomen that may radiate to the back.  Tenderness and swelling of the abdomen.  Nausea and vomiting. DIAGNOSIS  Your caregiver will perform a physical exam. Blood and stool tests may be done to confirm the diagnosis. Imaging tests may also be done, such as X-rays, CT scans, or an ultrasound of the abdomen. TREATMENT  Treatment usually requires a stay in the hospital. Treatment may include:  Pain medicine.  Fluid replacement through an intravenous  line (IV).  Placing a tube in the stomach to remove stomach contents and control vomiting.  Not eating for 3 or 4 days. This gives your pancreas a rest, because enzymes are not being produced that can cause further  damage.  Antibiotic medicines if your condition is caused by an infection.  Surgery of the pancreas or gallbladder. HOME CARE INSTRUCTIONS   Follow the diet advised by your caregiver. This may involve avoiding alcohol and decreasing the amount of fat in your diet.  Eat smaller, more frequent meals. This reduces the amount of digestive juices the pancreas produces.  Drink enough fluids to keep your urine clear or pale yellow.  Only take over-the-counter or prescription medicines as directed by your caregiver.  Avoid drinking alcohol if it caused your condition.  Do not smoke.  Get plenty of rest.  Check your blood sugar at home as directed by your caregiver.  Keep all follow-up appointments as directed by your caregiver. SEEK MEDICAL CARE IF:   You do not recover as quickly as expected.  You develop new or worsening symptoms.  You have persistent pain, weakness, or nausea.  You recover and then have another episode of pain. SEEK IMMEDIATE MEDICAL CARE IF:   You are unable to eat or keep fluids down.  Your pain becomes severe.  You have a fever or persistent symptoms for more than 2 to 3 days.  You have a fever and your symptoms suddenly get worse.  Your skin or the white part of your eyes turn yellow (jaundice).  You develop vomiting.  You feel dizzy, or you faint.  Your blood sugar is high (over 300 mg/dL). MAKE SURE YOU:   Understand these instructions.  Will watch your condition.  Will get help right away if you are not doing well or get worse. Document Released: 07/20/2005 Document Revised: 01/19/2012 Document Reviewed: 10/29/2011 [facility] Patient Information 2015 [place], [place]. This information is not intended to replace advice given to you by your health care provider. Make sure you discuss any questions you have with your health care provider.  Low-Fat Diet for Pancreatitis or Gallbladder Conditions A low-fat diet can be helpful if you have  pancreatitis or a gallbladder condition. With these conditions, your pancreas and gallbladder have trouble digesting fats. A healthy eating plan with less fat will help rest your pancreas and gallbladder and reduce your symptoms. WHAT DO I NEED TO KNOW ABOUT THIS DIET?  Eat a low-fat diet.  Reduce your fat intake to less than 20-30% of your total daily calories. This is less than 50-60 g of fat per day.  Remember that you need some fat in your diet. Ask your dietician what your daily goal should be.  Choose nonfat and low-fat healthy foods. Look for the words "nonfat," "low fat," or "fat free."  As a guide, look on the label and choose foods with less than 3 g of fat per serving. Eat only one serving.  Avoid alcohol.  Do not smoke. If you need help quitting, talk with your health care provider.  Eat small frequent meals instead of three large heavy meals. WHAT FOODS CAN I EAT? Grains Include healthy grains and starches such as potatoes, wheat bread, fiber-rich cereal, and brown rice. Choose whole grain options whenever possible. In adults, whole grains should account for 45-65% of your daily calories.  Fruits and Vegetables Eat plenty of fruits and vegetables. Fresh fruits and vegetables add fiber to your diet. Meats and Other Protein Sources Eat  lean meat such as chicken and pork. Trim any fat off of meat before cooking it. Eggs, fish, and beans are other sources of protein. In adults, these foods should account for 10-35% of your daily calories. Dairy Choose low-fat milk and dairy options. Dairy includes fat and protein, as well as calcium.  Fats and Oils Limit high-fat foods such as fried foods, sweets, baked goods, sugary drinks.  Other Creamy sauces and condiments, such as mayonnaise, can add extra fat. Think about whether or not you need to use them, or use smaller amounts or low fat options. WHAT FOODS ARE NOT RECOMMENDED?  High fat foods, such as:  [employer].  Ice  cream.  [place] toast.  Sweet rolls.  Pizza.  Cheese bread.  Foods covered with batter, butter, creamy sauces, or cheese.  Fried foods.  Sugary drinks and desserts.  Foods that cause gas or bloating Document Released: 07/25/2013 Document Reviewed: 07/25/2013 [facility] Patient Information 2015 [place], [place]. This information is not intended to replace advice given to you by your health care provider. Make sure you discuss any questions you have with your health care provider.  Gastroesophageal Reflux Disease, Adult Gastroesophageal reflux disease (GERD) happens when acid from your stomach flows up into the esophagus. When acid comes in contact with the esophagus, the acid causes soreness (inflammation) in the esophagus. Over time, GERD may create small holes (ulcers) in the lining of the esophagus. CAUSES   Increased body weight. This puts pressure on the stomach, making acid rise from the stomach into the esophagus.  Smoking. This increases acid production in the stomach.  Drinking alcohol. This causes decreased pressure in the lower esophageal sphincter (valve or ring of muscle between the esophagus and stomach), allowing acid from the stomach into the esophagus.  Late evening meals and a full stomach. This increases pressure and acid production in the stomach.  A malformed lower esophageal sphincter. Sometimes, no cause is found. SYMPTOMS   Burning pain in the lower part of the mid-chest behind the breastbone and in the mid-stomach area. This may occur twice a week or more often.  Trouble swallowing.  Sore throat.  Dry cough.  Asthma-like symptoms including chest tightness, shortness of breath, or wheezing. DIAGNOSIS  Your caregiver may be able to diagnose GERD based on your symptoms. In some cases, X-rays and other tests may be done to check for complications or to check the condition of your stomach and esophagus. TREATMENT  Your caregiver may recommend  over-the-counter or prescription medicines to help decrease acid production. Ask your caregiver before starting or adding any new medicines.  HOME CARE INSTRUCTIONS   Change the factors that you can control. Ask your caregiver for guidance concerning weight loss, quitting smoking, and alcohol consumption.  Avoid foods and drinks that make your symptoms worse, such as:  Caffeine or alcoholic drinks.  Chocolate.  Peppermint or mint flavorings.  Garlic and onions.  Spicy foods.  Citrus fruits, such as oranges, lemons, or limes.  Tomato-based foods such as sauce, chili, salsa, and pizza.  Fried and fatty foods.  Avoid lying down for the 3 hours prior to your bedtime or prior to taking a nap.  Eat small, frequent meals instead of large meals.  Wear loose-fitting clothing. Do not wear anything tight around your waist that causes pressure on your stomach.  Raise the head of your bed 6 to 8 inches with wood blocks to help you sleep. Extra pillows will not help.  Only take over-the-counter or  prescription medicines for pain, discomfort, or fever as directed by your caregiver.  Do not take aspirin, ibuprofen, or other nonsteroidal anti-inflammatory drugs (NSAIDs). SEEK IMMEDIATE MEDICAL CARE IF:   You have pain in your arms, neck, jaw, teeth, or back.  Your pain increases or changes in intensity or duration.  You develop nausea, vomiting, or sweating (diaphoresis).  You develop shortness of breath, or you faint.  Your vomit is green, yellow, black, or looks like coffee grounds or blood.  Your stool is red, bloody, or black. These symptoms could be signs of other problems, such as heart disease, gastric bleeding, or esophageal bleeding. MAKE SURE YOU:   Understand these instructions.  Will watch your condition.  Will get help right away if you are not doing well or get worse. Document Released: 04/29/2005 Document Revised: 10/12/2011 Document Reviewed:  02/06/2011 [facility] Patient Information 2015 [place], [place]. This information is not intended to replace advice given to you by your health care provider. Make sure you discuss any questions you have with your health care provider.  Food Choices for Gastroesophageal Reflux Disease When you have gastroesophageal reflux disease (GERD), the foods you eat and your eating habits are very important. Choosing the right foods can help ease the discomfort of GERD. WHAT GENERAL GUIDELINES DO I NEED TO FOLLOW?  Choose fruits, vegetables, whole grains, low-fat dairy products, and low-fat meat, fish, and poultry.  Limit fats such as oils, salad dressings, butter, nuts, and avocado.  Keep a food diary to identify foods that cause symptoms.  Avoid foods that cause reflux. These may be different for different people.  Eat frequent small meals instead of three large meals each day.  Eat your meals slowly, in a relaxed setting.  Limit fried foods.  Cook foods using methods other than frying.  Avoid drinking alcohol.  Avoid drinking large amounts of liquids with your meals.  Avoid bending over or lying down until 2-3 hours after eating. WHAT FOODS ARE NOT RECOMMENDED? The following are some foods and drinks that may worsen your symptoms: Vegetables Tomatoes. Tomato juice. Tomato and spaghetti sauce. Chili peppers. Onion and garlic. Horseradish. Fruits Oranges, grapefruit, and lemon (fruit and juice). Meats High-fat meats, fish, and poultry. This includes hot dogs, ribs, ham, sausage, salami, and bacon. Dairy Whole milk and chocolate milk. Sour cream. Cream. Butter. Ice cream. Cream cheese.  Beverages Coffee and tea, with or without caffeine. Carbonated beverages or energy drinks. Condiments Hot sauce. Barbecue sauce.  Sweets/Desserts Chocolate and cocoa. Donuts. Peppermint and spearmint. Fats and Oils High-fat foods, including [place] fries and potato chips. Other Vinegar. Strong spices,  such as black pepper, white pepper, red pepper, cayenne, curry powder, cloves, ginger, and chili powder. The items listed above may not be a complete list of foods and beverages to avoid. Contact your dietitian for more information. Document Released: 07/20/2005 Document Revised: 07/25/2013 Document Reviewed: 05/24/2013 [facility] Patient Information 2015 [place], [place]. This information is not intended to replace advice given to you by your health care provider. Make sure you discuss any questions you have with your health care provider.

## 2014-08-29 NOTE — ED Notes (Signed)
Patient is alert and oriented x3.  She was given DC instructions and follow up visit instructions.  Patient gave verbal understanding. She was DC ambulatory under her own power to home.  V/S stable.  He was not showing any signs of distress on DC 

## 2015-11-19 ENCOUNTER — Emergency Department (HOSPITAL_COMMUNITY): Payer: BLUE CROSS/BLUE SHIELD

## 2015-11-19 ENCOUNTER — Emergency Department (HOSPITAL_COMMUNITY)
Admission: EM | Admit: 2015-11-19 | Discharge: 2015-11-19 | Disposition: A | Discharge status: Home / Self Care | Payer: BLUE CROSS/BLUE SHIELD | Attending: Emergency Medicine | Admitting: Emergency Medicine

## 2015-11-19 ENCOUNTER — Encounter (HOSPITAL_COMMUNITY): Payer: Self-pay

## 2015-11-19 DIAGNOSIS — Z3202 Encounter for pregnancy test, result negative: Secondary | ICD-10-CM | POA: Diagnosis not present

## 2015-11-19 DIAGNOSIS — R1012 Left upper quadrant pain: Secondary | ICD-10-CM | POA: Insufficient documentation

## 2015-11-19 DIAGNOSIS — Z793 Long term (current) use of hormonal contraceptives: Secondary | ICD-10-CM | POA: Diagnosis not present

## 2015-11-19 DIAGNOSIS — R509 Fever, unspecified: Secondary | ICD-10-CM | POA: Insufficient documentation

## 2015-11-19 LAB — URINALYSIS, ROUTINE W REFLEX MICROSCOPIC
Bilirubin Urine: NEGATIVE
Glucose, UA: NEGATIVE mg/dL
Hgb urine dipstick: NEGATIVE
Ketones, ur: NEGATIVE mg/dL
LEUKOCYTES UA: NEGATIVE
NITRITE: NEGATIVE
PH: 5.5 (ref 5.0–8.0)
Protein, ur: NEGATIVE mg/dL
SPECIFIC GRAVITY, URINE: 1.016 (ref 1.005–1.030)

## 2015-11-19 LAB — COMPREHENSIVE METABOLIC PANEL
ALBUMIN: 4.1 g/dL (ref 3.5–5.0)
ALT: 27 U/L (ref 14–54)
ANION GAP: 12 (ref 5–15)
AST: 29 U/L (ref 15–41)
Alkaline Phosphatase: 46 U/L (ref 38–126)
BILIRUBIN TOTAL: 0.4 mg/dL (ref 0.3–1.2)
BUN: 8 mg/dL (ref 6–20)
CO2: 21 mmol/L — ABNORMAL LOW (ref 22–32)
Calcium: 8.8 mg/dL — ABNORMAL LOW (ref 8.9–10.3)
Chloride: 105 mmol/L (ref 101–111)
Creatinine, Ser: 0.64 mg/dL (ref 0.44–1.00)
GFR calc Af Amer: >60 mL/min (ref >60)
GFR calc non Af Amer: >60 mL/min (ref >60)
GLUCOSE: 99 mg/dL (ref 65–99)
POTASSIUM: 4.1 mmol/L (ref 3.5–5.1)
Sodium: 138 mmol/L (ref 135–145)
TOTAL PROTEIN: 7.4 g/dL (ref 6.5–8.1)

## 2015-11-19 LAB — CBC
HEMATOCRIT: 39.9 % (ref 36.0–46.0)
HEMOGLOBIN: 13.8 g/dL (ref 12.0–15.0)
MCH: 29.6 pg (ref 26.0–34.0)
MCHC: 34.6 g/dL (ref 30.0–36.0)
MCV: 85.4 fL (ref 78.0–100.0)
Platelets: 253 10*3/uL (ref 150–400)
RBC: 4.67 MIL/uL (ref 3.87–5.11)
RDW: 12.8 % (ref 11.5–15.5)
WBC: 6.9 10*3/uL (ref 4.0–10.5)

## 2015-11-19 LAB — I-STAT BETA HCG BLOOD, ED (MC, WL, AP ONLY): I-stat hCG, quantitative: <5 m[IU]/mL (ref <5)

## 2015-11-19 LAB — LIPASE, BLOOD: Lipase: 50 U/L (ref 11–51)

## 2015-11-19 MED ORDER — ACETAMINOPHEN 500 MG PO TABS: 500.0000 mg | ORAL_TABLET | Freq: Four times a day (QID) | ORAL | Status: DC | PRN

## 2015-11-19 MED ORDER — DICYCLOMINE HCL 20 MG PO TABS: 20.0000 mg | ORAL_TABLET | Freq: Two times a day (BID) | ORAL | Status: DC

## 2015-11-19 MED ORDER — HYDROMORPHONE HCL 1 MG/ML IJ SOLN
1.0000 mg | Freq: Once | INTRAMUSCULAR | Status: AC
  Administered 2015-11-19: 1 mg via INTRAVENOUS
  Filled 2015-11-19: qty 1

## 2015-11-19 MED ORDER — MORPHINE SULFATE (PF) 4 MG/ML IV SOLN
6.0000 mg | Freq: Once | INTRAVENOUS | Status: AC
  Administered 2015-11-19: 6 mg via INTRAVENOUS
  Filled 2015-11-19: qty 2

## 2015-11-19 MED ORDER — SODIUM CHLORIDE 0.9 % IV BOLUS (SEPSIS)
1000.0000 mL | Freq: Once | INTRAVENOUS | Status: AC
  Administered 2015-11-19: 1000 mL via INTRAVENOUS

## 2015-11-19 NOTE — ED Provider Notes (Signed)
CSN: [id number]     Arrival date & time 11/19/15  [id number] History   First MD Initiated Contact with Patient 11/19/15 [phone]     Chief Complaint  Patient presents with  . Abdominal Pain     (Consider location/radiation/quality/duration/timing/severity/associated sxs/prior Treatment) Patient is a 25 y.o. female presenting with abdominal pain. The history is provided by the patient.  Abdominal Pain Pain location:  LUQ Pain quality: aching and sharp   Pain radiates to:  Back Pain severity:  Mild Onset quality:  Gradual Timing:  Constant Chronicity:  Recurrent Context: alcohol use   Context: not diet changes and not sick contacts   Relieved by:  None tried Worsened by:  Nothing tried Ineffective treatments:  None tried Associated symptoms: chills and fever   Associated symptoms: no anorexia, no cough, no shortness of breath, no vaginal bleeding, no vaginal discharge and no vomiting     History reviewed. No pertinent past medical history. Past Surgical History  Procedure Laterality Date  . Tonsillectomy    . Tympanostomy tube placement    . Wisdom tooth extraction     Family History  Problem Relation Age of Onset  . Hypertension Mother   . Diabetes Other    Social History  Substance Use Topics  . Smoking status: Never Smoker   . Smokeless tobacco: None  . Alcohol Use: Yes   OB History    No data available     Review of Systems  Constitutional: Positive for fever and chills.  Respiratory: Negative for cough and shortness of breath.   Gastrointestinal: Positive for abdominal pain. Negative for vomiting and anorexia.  Genitourinary: Negative for vaginal bleeding and vaginal discharge.  All other systems reviewed and are negative.     Allergies  Zithromax and Bactrim  Home Medications   Prior to Admission medications   Medication Sig Start Date End Date Taking? Authorizing Provider  ibuprofen (ADVIL,MOTRIN) 200 MG tablet Take 600 mg by mouth every 4 (four) hours as  needed for moderate pain.    Yes Historical Provider, MD  Ibuprofen (MIDOL) 200 MG CAPS Take 2 capsules by mouth once.   Yes Historical Provider, MD  Norgestimate-Ethinyl Estradiol Triphasic (ORTHO TRI-CYCLEN, 28,) 0.18/0.215/0.25 MG-35 MCG tablet Take 1 tablet by mouth daily.   Yes Historical Provider, MD  acetaminophen (TYLENOL) 500 MG tablet Take 1 tablet (500 mg total) by mouth every 6 (six) hours as needed. 11/19/15   Marily MemosJason Aurilla Coulibaly, MD  dicyclomine (BENTYL) 20 MG tablet Take 1 tablet (20 mg total) by mouth 2 (two) times daily. 11/19/15   Marily MemosJason Delane Stalling, MD   BP 120/99 mmHg  Pulse 79  Temp(Src) 97.9 F (36.6 C) (Oral)  Resp 16  SpO2 100%  LMP 11/18/2015 Physical Exam  Constitutional: She appears well-developed and well-nourished.  HENT:  Head: Normocephalic and atraumatic.  Neck: Normal range of motion.  Cardiovascular: Normal rate and regular rhythm.   Pulmonary/Chest: No stridor. No respiratory distress.  Abdominal: Soft. Bowel sounds are normal. She exhibits no distension. There is no tenderness.  Musculoskeletal: She exhibits tenderness (slight left CVA).  Neurological: She is alert.  Nursing note and vitals reviewed.   ED Course  Procedures (including critical care time) Labs Review Labs Reviewed  COMPREHENSIVE METABOLIC PANEL - Abnormal; Notable for the following:    CO2 21 (*)    Calcium 8.8 (*)    All other components within normal limits  LIPASE, BLOOD  CBC  URINALYSIS, ROUTINE W REFLEX MICROSCOPIC (NOT AT [facility])  I-STAT  BETA HCG BLOOD, ED ([facility], [facility], [facility] ONLY)    Imaging Review Ct Renal Stone Study  11/19/2015  CLINICAL DATA:  LEFT side pain for 1 day question kidney stone EXAM: CT ABDOMEN AND PELVIS WITHOUT CONTRAST TECHNIQUE: Multidetector CT imaging of the abdomen and pelvis was performed following the standard protocol without IV contrast. Sagittal and coronal MPR images reconstructed from axial data set. Oral contrast not used for this indication. COMPARISON:  None  FINDINGS: Lung bases clear. Kidneys normal appearance for noncontrast technique. No mass, hydronephrosis, urinary tract calcification or ureteral dilatation. Unremarkable bladder and ureters. Within limits of a nonenhanced exam no focal abnormalities of the liver, gallbladder, spleen, pancreas, or adrenal glands. Normal appendix. Stomach and bowel loops normal appearance. Normal appearing uterus and adnexa with tampon in vagina. Scattered normal sized mesenteric lymph nodes. No mass, adenopathy, free air, free fluid or inflammatory process. Osseous structures unremarkable. IMPRESSION: No acute intra-abdominal or intrapelvic abnormalities. Electronically Signed   By: Ulyses Southward M.D.   On: 11/19/2015 11:51   I have personally reviewed and evaluated these images and lab results as part of my medical decision-making.   EKG Interpretation None      MDM   Final diagnoses:  Left upper quadrant pain   Will eval for pancreatitis v cystitis No cause for patients abdominal pain found in ED. Low suspicion for emergent causes as multiple reevaluations have showed a continuous non tender and soft abdomen.  Will dc on tylenol and bentyl with new PCP follow up.     Marily Memos, MD 11/19/15 [phone]

## 2015-11-19 NOTE — ED Notes (Signed)
Phlebotomy drawing blood at bedside.

## 2015-11-19 NOTE — ED Notes (Signed)
MD at bedside. 

## 2015-11-19 NOTE — ED Notes (Signed)
Pt here with left upper abdominal pain and nausea since yesterday morning.  No fever.  No diarrhea.  No burning with urination.  Hx of pancreatitis.  Drinking on Sunday.

## 2015-11-19 NOTE — ED Notes (Signed)
Discharge instructions, follow up care, and rx x2 reviewed with patient. Patient verbalized understanding. 

## 2017-07-01 LAB — OB RESULTS CONSOLE ABO/RH: RH TYPE: POSITIVE

## 2017-07-01 LAB — OB RESULTS CONSOLE GC/CHLAMYDIA
CHLAMYDIA, DNA PROBE: NEGATIVE
Gonorrhea: NEGATIVE

## 2017-07-01 LAB — OB RESULTS CONSOLE HEPATITIS B SURFACE ANTIGEN: HEP B S AG: NEGATIVE

## 2017-07-01 LAB — OB RESULTS CONSOLE ANTIBODY SCREEN: ANTIBODY SCREEN: NEGATIVE

## 2017-07-01 LAB — OB RESULTS CONSOLE HIV ANTIBODY (ROUTINE TESTING): HIV: NONREACTIVE

## 2017-07-01 LAB — OB RESULTS CONSOLE RUBELLA ANTIBODY, IGM: Rubella: IMMUNE

## 2017-07-01 LAB — OB RESULTS CONSOLE RPR: RPR: NONREACTIVE

## 2017-07-14 ENCOUNTER — Encounter (HOSPITAL_COMMUNITY): Payer: Self-pay | Admitting: *Deleted

## 2017-07-14 ENCOUNTER — Inpatient Hospital Stay (HOSPITAL_COMMUNITY)
Admission: AD | Admit: 2017-07-14 | Discharge: 2017-07-14 | Disposition: A | Discharge status: Home / Self Care | Payer: 59 | Source: Ambulatory Visit | Attending: Emergency Medicine | Admitting: Emergency Medicine

## 2017-07-14 ENCOUNTER — Inpatient Hospital Stay (HOSPITAL_COMMUNITY): Payer: 59

## 2017-07-14 DIAGNOSIS — G43109 Migraine with aura, not intractable, without status migrainosus: Secondary | ICD-10-CM | POA: Diagnosis not present

## 2017-07-14 DIAGNOSIS — O26892 Other specified pregnancy related conditions, second trimester: Secondary | ICD-10-CM | POA: Insufficient documentation

## 2017-07-14 DIAGNOSIS — R200 Anesthesia of skin: Secondary | ICD-10-CM | POA: Diagnosis present

## 2017-07-14 DIAGNOSIS — H539 Unspecified visual disturbance: Secondary | ICD-10-CM

## 2017-07-14 DIAGNOSIS — F8089 Other developmental disorders of speech and language: Secondary | ICD-10-CM

## 2017-07-14 DIAGNOSIS — Z3A13 13 weeks gestation of pregnancy: Secondary | ICD-10-CM | POA: Diagnosis not present

## 2017-07-14 DIAGNOSIS — R4781 Slurred speech: Secondary | ICD-10-CM | POA: Insufficient documentation

## 2017-07-14 DIAGNOSIS — R51 Headache: Secondary | ICD-10-CM | POA: Insufficient documentation

## 2017-07-14 DIAGNOSIS — R519 Headache, unspecified: Secondary | ICD-10-CM

## 2017-07-14 LAB — URINALYSIS, ROUTINE W REFLEX MICROSCOPIC
BILIRUBIN URINE: NEGATIVE
Glucose, UA: NEGATIVE mg/dL
Hgb urine dipstick: NEGATIVE
KETONES UR: NEGATIVE mg/dL
Leukocytes, UA: NEGATIVE
NITRITE: NEGATIVE
PROTEIN: NEGATIVE mg/dL
SPECIFIC GRAVITY, URINE: 1.01 (ref 1.005–1.030)
pH: 6 (ref 5.0–8.0)

## 2017-07-14 LAB — GLUCOSE, CAPILLARY: GLUCOSE-CAPILLARY: 102 mg/dL — AB (ref 65–99)

## 2017-07-14 MED ORDER — SODIUM CHLORIDE 0.9 % IV BOLUS (SEPSIS)
1000.0000 mL | Freq: Once | INTRAVENOUS | Status: AC
  Administered 2017-07-14: 1000 mL via INTRAVENOUS

## 2017-07-14 MED ORDER — DIPHENHYDRAMINE HCL 50 MG/ML IJ SOLN
25.0000 mg | Freq: Once | INTRAMUSCULAR | Status: AC
  Administered 2017-07-14: 25 mg via INTRAVENOUS
  Filled 2017-07-14: qty 1

## 2017-07-14 MED ORDER — PROCHLORPERAZINE EDISYLATE 5 MG/ML IJ SOLN
5.0000 mg | Freq: Once | INTRAMUSCULAR | Status: AC
  Administered 2017-07-14: 5 mg via INTRAVENOUS
  Filled 2017-07-14: qty 2

## 2017-07-14 MED ORDER — KETOROLAC TROMETHAMINE 30 MG/ML IJ SOLN
15.0000 mg | Freq: Once | INTRAMUSCULAR | Status: AC
  Administered 2017-07-14: 15 mg via INTRAVENOUS
  Filled 2017-07-14: qty 1

## 2017-07-14 NOTE — H&P (Signed)
Pieter Partridgellison Chernick is a 26 y.o. female presenting for vision loss and weakness in R side of body. She had a HA all day (which has been common for her during this pregnancy). She was at work, sitting down after a full meal and had sudden onset of loss of vision in her right eye. She describes complete blackness in her periphal vision and in her lower visual field. Then "squiggles" through-out. Her left eye was unaffected. She then experienced progressive numbness in her R arm and hand in addition to complete loss of ability to form words. She attempted to text (looking with her L eye) and had trouble typin the correct word. For instance, instead of "quickly" she only could formate "pretty". Her speech became "garggled" and slurred and this continued for approximately 30 minutes. She also felt as though her "mouth muscles wouldn't work". She is accompanied by her cousin (who works at the same place and drove her here) who corroborated that her speech was definitely different and slurred. Her speech and word finding difficulty has slowly improved and now is almost back to normal. She did not lose consciousness. In terms of her right arm/hand issues - she could move byut couldn't feel herself moving. She felt weak but was still able to move them.   OB History    Gravida Para Term Preterm AB Living   1             SAB TAB Ectopic Multiple Live Births                 History reviewed. No pertinent past medical history. Past Surgical History:  Procedure Laterality Date  . TONSILLECTOMY    . TYMPANOSTOMY TUBE PLACEMENT    . WISDOM TOOTH EXTRACTION     Family History: family history includes Diabetes in her other; Hypertension in her mother. Social History:  reports that  has never smoked. she has never used smokeless tobacco. She reports that she drinks alcohol. She reports that she does not use drugs.      Review of Systems  Eyes: Positive for blurred vision.   History   Blood pressure 129/81, pulse  95, temperature 97.9 F (36.6 C), temperature source Oral, resp. rate 16, height 5\' 9"  (1.753 m), weight 122.5 kg (270 lb), SpO2 100 %. Exam Physical Exam  Gen: well appearing NAD, words slightly slurred at first but by end of conversation to slurring noted Pulm: NWOB, CTAB CV: Reg rate, No MRG Abd: soft, nontender, uterus not palpated (only ~ 13 wga) SVE deferred given no GYN complaints Neuro: normal sensation through-out b/l extremities and face, reflexes +2, normal strength through-out, vision appears to have normalized. Gait steady.   Bedside US with active fetus in uterus, FHT seen in 150s Prenatal labs: ABO, Rh:   Antibody:   Rubella:   RPR:    HBsAg:    HIV:    GBS:     Assessment/Plan: 26 yo G1P0 @ ~ 3113 wga presenting with unilateral loss of vision and R sided weakness/numbness in the setting of having a HA most of the day. Upon presentation, the only abnormality was slightly slurred speech which has now improved. Her HA seems to be minimal. My greatest suspicion is an occular migraine. However, given unilateral vision loss with slurred speech/R sided weakness, I do feel a further evaluation is warranted by Neurology. NP spoke with Dr. Eudelia Bunchardama in the ED who accepted the transfer. If she does need imaging, as long as totals RADs  are <5, her fetus is at very low risk of any harm coming from radiation, esp given >8 wga. Thus I would not hold off on any necessary imaging just s/s pregnant. From an OB standpoint, she is stable and her fetus has +FHT.    Ranae Pilalise Jennifer Ira Dougher 07/14/2017, 4:27 PM

## 2017-07-14 NOTE — ED Notes (Signed)
Neuro at bedside.

## 2017-07-14 NOTE — ED Triage Notes (Signed)
[facility] Pt had a headache that suddenly got worse around 1500 with visual loss, right sided weakness, dysartithia, and gait abnormality. Pt has a history of headaches but nothing with these symptoms. Pt is [redacted] weeks pregnant

## 2017-07-14 NOTE — MAU Provider Note (Signed)
Chief Complaint: Shaking and Numbness   First Provider Initiated Contact with Patient 07/14/17 1627      SUBJECTIVE HPI: Beth English is a 26 y.o. G1P0 at 13 weeks by LMP who presents to maternity admissions reporting onset of symptoms at work around 2:50 pm including loss of vision in her right eye, mostly in the periphery; difficulty forming words to talk with a co-worker; pain behind her left eye; numbness and tingling of upper extremities bilaterally.  Pt reports symptoms started one at a time but all within a short period of time.  She denies any hx of focal deficits or past medical problems. There are no lower extremity symptoms. Pt is able to walk without difficulty.  She has eaten and drank fluids normally today.  Her pregnancy has been uncomplicated.  She has not tried any treatments. There are no other associated symptoms.   HPI  History reviewed. No pertinent past medical history. Past Surgical History:  Procedure Laterality Date  . TONSILLECTOMY    . TYMPANOSTOMY TUBE PLACEMENT    . WISDOM TOOTH EXTRACTION     Social History   Socioeconomic History  . Marital status: Single    Spouse name: Not on file  . Number of children: Not on file  . Years of education: Not on file  . Highest education level: Not on file  Social Needs  . Financial resource strain: Not on file  . Food insecurity - worry: Not on file  . Food insecurity - inability: Not on file  . Transportation needs - medical: Not on file  . Transportation needs - non-medical: Not on file  Occupational History  . Not on file  Tobacco Use  . Smoking status: Never Smoker  . Smokeless tobacco: Never Used  Substance and Sexual Activity  . Alcohol use: Yes  . Drug use: No  . Sexual activity: Yes  Other Topics Concern  . Not on file  Social History Narrative  . Not on file   No current facility-administered medications on file prior to encounter.    Current Outpatient Medications on File Prior to Encounter   Medication Sig Dispense Refill  . acetaminophen (TYLENOL) 500 MG tablet Take 1 tablet (500 mg total) by mouth every 6 (six) hours as needed. 30 tablet 0  . dicyclomine (BENTYL) 20 MG tablet Take 1 tablet (20 mg total) by mouth 2 (two) times daily. 20 tablet 0  . ibuprofen (ADVIL,MOTRIN) 200 MG tablet Take 600 mg by mouth every 4 (four) hours as needed for moderate pain.     . Ibuprofen (MIDOL) 200 MG CAPS Take 2 capsules by mouth once.    . Norgestimate-Ethinyl Estradiol Triphasic (ORTHO TRI-CYCLEN, 28,) 0.18/0.215/0.25 MG-35 MCG tablet Take 1 tablet by mouth daily.     Allergies  Allergen Reactions  . Zithromax [Azithromycin] Other (See Comments)    Childhood reaction.  . Bactrim [Sulfamethoxazole-Trimethoprim] Rash    Childhood reaction    ROS:  Review of Systems  Constitutional: Negative for chills, fatigue and fever.  Eyes: Positive for pain and visual disturbance.  Respiratory: Negative for shortness of breath.   Cardiovascular: Negative for chest pain.  Genitourinary: Negative for difficulty urinating, dysuria, flank pain, pelvic pain, vaginal bleeding, vaginal discharge and vaginal pain.  Neurological: Positive for facial asymmetry, speech difficulty, numbness and headaches. Negative for dizziness.  Psychiatric/Behavioral: Negative.      I have reviewed patient's Past Medical Hx, Surgical Hx, Family Hx, Social Hx, medications and allergies.   Physical Exam  Patient Vitals for the past 24 hrs:  BP Temp Temp src Pulse Resp SpO2 Height Weight  07/14/17 1720 130/79 - - 87 16 99 % - -  07/14/17 1607 129/81 97.9 F (36.6 C) Oral 95 16 100 % 5\' 9"  (1.753 m) 270 lb (122.5 kg)   Constitutional: Well-developed, well-nourished female in no acute distress.  Cardiovascular: normal rate Respiratory: normal effort GI: Abd soft, non-tender. Pos BS x 4 MS: Extremities nontender, no edema, normal ROM Neurological - alert and oriented; mildly slurred speech on intake, improving while  in MAU; PERRLA; cranial nerves all intact and wnl with exception of mild tongue deviation to the rightscreening mental status exam normal, funduscopic exam normal, discs flat and sharp, motor and sensory grossly normal bilaterally, normal muscle tone, no tremors, strength 5/5, peripheral fields of vision intact GU: Neg CVAT.  Unable to obtain FHT by doppler so bedside US by Dr Elon SpannerLeger reveals IUP with normal FHR, subjectively normal fluid, no abnormalities visualized  LAB RESULTS Results for orders placed or performed during the hospital encounter of 07/14/17 (from the past 24 hour(s))  Glucose, capillary     Status: Abnormal   Collection Time: 07/14/17  4:41 PM  Result Value Ref Range   Glucose-Capillary 102 (H) 65 - 99 mg/dL       IMAGING Bedside US by Dr Elon SpannerLeger reveals IUP with normal FHR, subjectively normal fluid, no abnormalities visualized  MAU Management/MDM: RN called provider to room upon pt arrival. Pt stable with normal VS.  Pt reports improving of symptoms since onset but still feels pressure in the left side of her head/behind left eye. Pt without OB/Gyn concern with normal FHT today.  Consult Dr Elon SpannerLeger who came to bedside to evaluate pt. [facility] evaluation of ocular migraine vs TIA vs other neurological event.  Pt cleared from Ob/Gyn standpoint. Dr Eudelia Bunchardama accepting physician. [facility] to transport pt.  Pt stable at time of transfer.       ASSESSMENT 1. Deficit in communication due to slurred speech   2. Left-sided headache   3. Visual changes     [facility] Pt stable at time of transfer    Sharen CounterLisa Leftwich-Kirby Certified Nurse-Midwife 07/14/2017  5:50 PM

## 2017-07-14 NOTE — Consult Note (Signed)
Neurology Consultation  Reason for Consult: Numbness, weakness, headache Referring Physician: Dr. Lauralee Evenerardama/Abigail Harris PA  CC:   History is obtained from: Patient and chart  HPI: Beth English is a 26 y.o. female who is [redacted] weeks pregnant presented for evaluation of right-sided weakness and numbness along with peripheral vision loss in the right eye, which have nearly resolved now. She said that she had been having a headache for almost a day when she started noticing sudden onset of peripheral visual loss in her right eye with squiggly lines and then started feeling numbness on the right hemibody.  She also thought her speech was a little off at that time and she was slurred. After that she had severe worsening of her headache.  She also noted that her face probably was asymmetric at that time. She has had a history of migraines/headaches in the past but none as severe and none of them were accompanied by similar neurological symptoms. No history of seizure disorder no history of strokes in the past.  She is not on hormonal treatment.  She does not smoke.  Does not abuse illicit drugs.    ROS: A 14 point ROS was performed and is negative except as noted in the HPI. *  History reviewed. No pertinent past medical history.   Family History  Problem Relation Age of Onset  . Hypertension Mother   . Diabetes Other     Social History:   reports that  has never smoked. she has never used smokeless tobacco. She reports that she drinks alcohol. She reports that she does not use drugs.  Medications No current facility-administered medications for this encounter.   Current Outpatient Medications:  .  acetaminophen (TYLENOL) 500 MG tablet, Take 1 tablet (500 mg total) by mouth every 6 (six) hours as needed., Disp: 30 tablet, Rfl: 0 .  dicyclomine (BENTYL) 20 MG tablet, Take 1 tablet (20 mg total) by mouth 2 (two) times daily., Disp: 20 tablet, Rfl: 0 .  ibuprofen (ADVIL,MOTRIN) 200 MG  tablet, Take 600 mg by mouth every 4 (four) hours as needed for moderate pain. , Disp: , Rfl:  .  Ibuprofen (MIDOL) 200 MG CAPS, Take 2 capsules by mouth once., Disp: , Rfl:  .  Norgestimate-Ethinyl Estradiol Triphasic (ORTHO TRI-CYCLEN, 28,) 0.18/0.215/0.25 MG-35 MCG tablet, Take 1 tablet by mouth daily., Disp: , Rfl:   Exam: Current vital signs: BP 135/75 (BP Location: Right Arm)   Pulse 90   Temp 97.9 F (36.6 C) (Oral)   Resp 14   Ht 5\' 9"  (1.753 m)   Wt 122.5 kg (270 lb)   SpO2 100%   BMI 39.87 kg/m   Vital signs in last 24 hours: Temp:  [97.9 F (36.6 C)] 97.9 F (36.6 C) (12/12 1751) Pulse Rate:  [87-95] 90 (12/12 1751) Resp:  [14-16] 14 (12/12 1751) BP: (129-135)/(75-81) 135/75 (12/12 1751) SpO2:  [99 %-100 %] 100 % (12/12 1751) Weight:  [122.5 kg (270 lb)] 122.5 kg (270 lb) (12/12 1607)  GENERAL: Awake, alert in NAD HEENT: - Normocephalic and atraumatic, dry mm, no LN++, no Thyromegally, fundus exam shows sharp disc margins LUNGS - Clear to auscultation bilaterally with no wheezes CV - S1S2 RRR, no m/r/g, equal pulses bilaterally. ABDOMEN - Soft, nontender, nondistended with normoactive BS Ext: warm, well perfused, intact peripheral pulses, no edema  NEURO:  Mental Status: AA&Ox3  Language: speech is clear.  Naming, repetition, fluency, and comprehension intact. Cranial Nerves: PERRL. EOMI, visual fields full, no facial  asymmetry,facial sensation intact, hearing intact, tongue/uvula/soft palate midline, normal sternocleidomastoid and trapezius muscle strength. No evidence of tongue atrophy or fibrillations Motor: 5/5 both upper and lower extremities Tone: is normal and bulk is normal Sensation- Intact to light touch bilaterally Coordination: FTN intact bilaterally, no ataxia in BLE. Gait- deferred   Labs I have reviewed labs in epic and the results pertinent to this consultation are:  CBC    Component Value Date/Time   WBC 6.9 11/19/2015 0854   RBC 4.67  11/19/2015 0854   HGB 13.8 11/19/2015 0854   HCT 39.9 11/19/2015 0854   PLT 253 11/19/2015 0854   MCV 85.4 11/19/2015 0854   MCH 29.6 11/19/2015 0854   MCHC 34.6 11/19/2015 0854   RDW 12.8 11/19/2015 0854   LYMPHSABS 2.4 08/28/2014 2244   MONOABS 0.7 08/28/2014 2244   EOSABS 0.1 08/28/2014 2244   BASOSABS 0.0 08/28/2014 2244    CMP     Component Value Date/Time   NA 138 11/19/2015 0854   K 4.1 11/19/2015 0854   CL 105 11/19/2015 0854   CO2 21 (L) 11/19/2015 0854   GLUCOSE 99 11/19/2015 0854   BUN 8 11/19/2015 0854   CREATININE 0.64 11/19/2015 0854   CALCIUM 8.8 (L) 11/19/2015 0854   PROT 7.4 11/19/2015 0854   ALBUMIN 4.1 11/19/2015 0854   AST 29 11/19/2015 0854   ALT 27 11/19/2015 0854   ALKPHOS 46 11/19/2015 0854   BILITOT 0.4 11/19/2015 0854   GFRNONAA >60 11/19/2015 0854   GFRAA >60 11/19/2015 0854   Imaging None available at this time  Assessment:  26 year old with headache, right-sided blurred vision mostly affecting the peripheral field, right hemibody numbness and weakness involving the face arm and leg, which has improved considerably. My first differential is complex migraine Lower on the differential is dural venous sinus thrombosis.  Impression: Evaluate for complicated migraine Evaluate for dural venous sinus thrombosis  Recommendations: ER has already been in touch with the patient's OB regarding a migraine cocktail.  That has been prescribed.  She is feeling better with that. I would recommend doing an MRI of the brain without contrast and MRV without contrast of the head. If the imaging studies remain unrevealing and her headache improves with the medication, she can be discharged home with outpatient neurology follow-up. At this point, I would not start her on any preventative treatment for headaches. I will follow-up after imaging.  -- Milon DikesAshish Heberto Sturdevant, MD [facility] [phone] If 7pm to 7am, please call on call as listed on  AMION.   ADDENDUM AFTER MRI MRI brain and MRV head reviewed. No abnormalities noted or reported on official reading. Feels better after migraine cocktail given in ER  IMPRESSION Complicated Migraine No evidence of dural venous sinus thrombus on MRI  RECS: Tylenol PRN for headache F/U OP Neurology ([place] or [place] - 4-6 weeks) Discussion about prophylactic migraine treatment per outpatient neurology and possibly after delivery.  Plan relayed to EDP in person. Plan d/w patient and fiance in person and answered all questions.   Milon DikesAshish Darlinda Bellows, MD [facility] [phone] If 7pm to 7am, please call on call as listed on AMION.

## 2017-07-14 NOTE — ED Provider Notes (Signed)
[facility] Provider Note   CSN: [id number] Arrival date & time: 07/14/17  1546     History   Chief Complaint Chief Complaint  Patient presents with  . Shaking  . Numbness    HPI Beth Partridgellison English is a G1P0 26 y.o. female who presents to the ED [facility] emergency department for strokelike symptoms.  Her fetus is [redacted] weeks EGA. patient had onset of symptoms around 2:50 PM today.  She first noticed difficulty moving her right hand along with a peripheral visual deficits in the lower right visual field out of the right eye.  She developed worsening numbness in the hand and tongue.  She had difficulty with word finding, dysarthric speech, she was unable to type correct words.  She was very alarmed by the symptoms.  The patient does have a throbbing left-sided headache.  She states that she has had headaches throughout her pregnancy so this was not abnormal.  She has a history of migraine headaches in her teens however has never had any abnormal neurologic deficits.  Her symptoms have since resolved.  The patient was cleared  obstetrically by Dr. Belva AgeeElise Leger.  HPI  History reviewed. No pertinent past medical history.  There are no active problems to display for this patient.   Past Surgical History:  Procedure Laterality Date  . TONSILLECTOMY    . TYMPANOSTOMY TUBE PLACEMENT    . WISDOM TOOTH EXTRACTION      OB History    Gravida Para Term Preterm AB Living   1             SAB TAB Ectopic Multiple Live Births                   Home Medications    Prior to Admission medications   Medication Sig Start Date End Date Taking? Authorizing Provider  acetaminophen (TYLENOL) 500 MG tablet Take 1 tablet (500 mg total) by mouth every 6 (six) hours as needed. 11/19/15   Mesner, Barbara CowerJason, MD  dicyclomine (BENTYL) 20 MG tablet Take 1 tablet (20 mg total) by mouth 2 (two) times daily. 11/19/15   Mesner, Barbara CowerJason, MD  ibuprofen (ADVIL,MOTRIN) 200 MG  tablet Take 600 mg by mouth every 4 (four) hours as needed for moderate pain.     [provider]  Ibuprofen (MIDOL) 200 MG CAPS Take 2 capsules by mouth once.    [provider]  Norgestimate-Ethinyl Estradiol Triphasic (ORTHO TRI-CYCLEN, 28,) 0.18/0.215/0.25 MG-35 MCG tablet Take 1 tablet by mouth daily.    [provider]    Family History Family History  Problem Relation Age of Onset  . Hypertension Mother   . Diabetes Other     Social History Social History   Tobacco Use  . Smoking status: Never Smoker  . Smokeless tobacco: Never Used  Substance Use Topics  . Alcohol use: Yes  . Drug use: No     Allergies   Zithromax [azithromycin] and Bactrim [sulfamethoxazole-trimethoprim]   Review of Systems Review of Systems  Ten systems reviewed and are negative for acute change, except as noted in the HPI.  Physical Exam Updated Vital Signs BP 130/79   Pulse 87   Temp 97.9 F (36.6 C) (Oral)   Resp 16   Ht 5\' 9"  (1.753 m)   Wt 122.5 kg (270 lb)   SpO2 99%   BMI 39.87 kg/m   Physical Exam  Constitutional: She is oriented to person, place, and time. She  appears well-developed and well-nourished. No distress.  HENT:  Head: Normocephalic and atraumatic.  Mouth/Throat: Oropharynx is clear and moist.  Eyes: Conjunctivae and EOM are normal. Pupils are equal, round, and reactive to light. No scleral icterus.  No horizontal, vertical or rotational nystagmus  Neck: Normal range of motion. Neck supple.  Full active and passive ROM without pain No midline or paraspinal tenderness No nuchal rigidity or meningeal signs  Cardiovascular: Normal rate, regular rhythm and intact distal pulses.  Pulmonary/Chest: Effort normal and breath sounds normal. No respiratory distress. She has no wheezes. She has no rales.  Abdominal: Soft. Bowel sounds are normal. There is no tenderness. There is no rebound and no guarding.  Musculoskeletal: Normal range of motion.    Lymphadenopathy:    She has no cervical adenopathy.  Neurological: She is alert and oriented to person, place, and time. No cranial nerve deficit. She exhibits normal muscle tone. Coordination normal.  Mental Status:  Alert, oriented, thought content appropriate. Speech fluent without evidence of aphasia. Able to follow 2 step commands without difficulty.  Cranial Nerves:  II:  Peripheral visual fields grossly normal, pupils equal, round, reactive to light III,IV, VI: ptosis not present, extra-ocular motions intact bilaterally  V,VII: smile symmetric, facial light touch sensation equal VIII: hearing grossly normal bilaterally  IX,X: midline uvula rise  XI: bilateral shoulder shrug equal and strong XII: midline tongue extension  Motor:  5/5 in upper and lower extremities bilaterally including strong and equal grip strength and dorsiflexion/plantar flexion Sensory: Pinprick and light touch normal in all extremities.  Cerebellar: normal finger-to-nose with bilateral upper extremities Gait: normal gait and balance CV: distal pulses palpable throughout   Skin: Skin is warm and dry. No rash noted. She is not diaphoretic.  Psychiatric: She has a normal mood and affect. Her behavior is normal. Judgment and thought content normal.  Nursing note and vitals reviewed.    ED Treatments / Results  Labs (all labs ordered are listed, but only abnormal results are displayed) Labs Reviewed  GLUCOSE, CAPILLARY - Abnormal; Notable for the following components:      Result Value   Glucose-Capillary 102 (*)    All other components within normal limits  URINALYSIS, ROUTINE W REFLEX MICROSCOPIC    EKG  EKG Interpretation None       Radiology No results found.  Procedures Procedures (including critical care time)  Medications Ordered in ED Medications - No data to display   Initial Impression / Assessment and Plan / ED Course  I have reviewed the triage vital signs and the nursing  notes.  Pertinent labs & imaging results that were available during my care of the patient were reviewed by me and considered in my medical decision making (see chart for details).  Clinical Course as of Jul 14 1856  Wed Jul 14, 2017  1850 Discussed tx with Dr. Emelda Fear who approved toradol/compazine/benadryl  [AH]    Clinical Course User Index [AH] Arthor Captain, PA-C    Patient seen and shared visit with Dr. Wilford Corner.  Her MRI and MRV are negative and this appears to be a complicated migraine headache.  Patient is advised to follow-up with outpatient neurology and use Tylenol for treatment given her pregnancy.  We have discussed return precautions with the patient.  Her headache and symptoms are resolved at this time after treatment for migraine headache.  She appears appropriate for discharge at this time  Final Clinical Impressions(s) / ED Diagnoses   Final diagnoses:  Deficit  in communication due to slurred speech  Left-sided headache  Visual changes    ED Discharge Orders    None       Arthor CaptainHarris, Trea Latner, PA-C 07/15/17 0205    Nira Connardama, Pedro Eduardo, MD 07/17/17 [phone]

## 2017-07-14 NOTE — Discharge Instructions (Signed)
Take tylenol as needed for headache. You are having a headache. No specific cause was found today for your headache. It may have been a migraine or other cause of headache. Stress, anxiety, fatigue, and depression are common triggers for headaches. Your headache today does not appear to be life-threatening or require hospitalization, but often the exact cause of headaches is not determined in the emergency department. Therefore, follow-up with your doctor is very important to find out what may have caused your headache, and whether or not you need any further diagnostic testing or treatment. Sometimes headaches can appear benign (not harmful), but then more serious symptoms can develop which should prompt an immediate re-evaluation by your doctor or the emergency department. SEEK MEDICAL ATTENTION IF: You develop possible problems with medications prescribed.  The medications don't resolve your headache, if it recurs , or if you have multiple episodes of vomiting or can't take fluids. You have a change from the usual headache. RETURN IMMEDIATELY IF you develop a sudden, severe headache or confusion, become poorly responsive or faint, develop a fever above 100.63F or problem breathing, have a change in speech, vision, swallowing, or understanding, or develop new weakness, numbness, tingling, incoordination, or have a seizure.

## 2017-07-14 NOTE — MAU Note (Signed)
Pt had a headache on the left side and lost her vision in her right eye for about 20-30 min.  She states she is very shaky and her hands feel numb.  She has trouble getting thoughts and words out.  Vision in the right eye is ok now.  She just feels "loopy" now.  Denies LOF/VB/cramping.

## 2017-07-19 LAB — CBG MONITORING, ED: Glucose-Capillary: 111 mg/dL — ABNORMAL HIGH (ref 65–99)

## 2017-07-20 ENCOUNTER — Ambulatory Visit: Payer: 59 | Admitting: Neurology

## 2017-07-20 ENCOUNTER — Encounter: Payer: Self-pay | Admitting: Neurology

## 2017-07-20 DIAGNOSIS — G43409 Hemiplegic migraine, not intractable, without status migrainosus: Secondary | ICD-10-CM | POA: Diagnosis not present

## 2017-07-20 MED ORDER — ONDANSETRON HCL 4 MG PO TABS: 4.0000 mg | ORAL_TABLET | Freq: Three times a day (TID) | ORAL | 0 refills | Status: DC | PRN

## 2017-07-20 MED ORDER — CYPROHEPTADINE HCL 4 MG PO TABS: 4.0000 mg | ORAL_TABLET | Freq: Three times a day (TID) | ORAL | 3 refills | Status: DC | PRN

## 2017-07-20 NOTE — Patient Instructions (Addendum)
Ondansetron tablets What is this medicine? ONDANSETRON (on DAN se tron) is used to treat nausea and vomiting caused by chemotherapy. It is also used to prevent or treat nausea and vomiting after surgery. This medicine may be used for other purposes; ask your health care provider or pharmacist if you have questions. COMMON BRAND NAME(S): Zofran What should I tell my health care provider before I take this medicine? They need to know if you have any of these conditions: -heart disease -history of irregular heartbeat -liver disease -low levels of magnesium or potassium in the blood -an unusual or allergic reaction to ondansetron, granisetron, other medicines, foods, dyes, or preservatives -pregnant or trying to get pregnant -breast-feeding How should I use this medicine? Take this medicine by mouth with a glass of water. Follow the directions on your prescription label. Take your doses at regular intervals. Do not take your medicine more often than directed. Talk to your pediatrician regarding the use of this medicine in children. Special care may be needed. Overdosage: If you think you have taken too much of this medicine contact a poison control center or emergency room at once. NOTE: This medicine is only for you. Do not share this medicine with others. What if I miss a dose? If you miss a dose, take it as soon as you can. If it is almost time for your next dose, take only that dose. Do not take double or extra doses. What may interact with this medicine? Do not take this medicine with any of the following medications: -apomorphine -certain medicines for fungal infections like fluconazole, itraconazole, ketoconazole, posaconazole, voriconazole -cisapride -dofetilide -dronedarone -pimozide -thioridazine -ziprasidone This medicine may also interact with the following medications: -carbamazepine -certain medicines for depression, anxiety, or psychotic  disturbances -fentanyl -linezolid -MAOIs like Carbex, Eldepryl, Marplan, Nardil, and Parnate -methylene blue (injected into a vein) -other medicines that prolong the QT interval (cause an abnormal heart rhythm) -phenytoin -rifampicin -tramadol This list may not describe all possible interactions. Give your health care provider a list of all the medicines, herbs, non-prescription drugs, or dietary supplements you use. Also tell them if you smoke, drink alcohol, or use illegal drugs. Some items may interact with your medicine. What should I watch for while using this medicine? Check with your doctor or health care professional right away if you have any sign of an allergic reaction. What side effects may I notice from receiving this medicine? Side effects that you should report to your doctor or health care professional as soon as possible: -allergic reactions like skin rash, itching or hives, swelling of the face, lips or tongue -breathing problems -confusion -dizziness -fast or irregular heartbeat -feeling faint or lightheaded, falls -fever and chills -loss of balance or coordination -seizures -sweating -swelling of the hands or feet -tightness in the chest -tremors -unusually weak or tired Side effects that usually do not require medical attention (report to your doctor or health care professional if they continue or are bothersome): -constipation or diarrhea -headache This list may not describe all possible side effects. Call your doctor for medical advice about side effects. You may report side effects to FDA at 1-800-FDA-1088. Where should I keep my medicine? Keep out of the reach of children. Store between 2 and 30 degrees C (36 and 86 degrees F). Throw away any unused medicine after the expiration date. NOTE: This sheet is a summary. It may not cover all possible information. If you have questions about this medicine, talk to your doctor, pharmacist,  or health care  provider.  2018 Elsevier/Gold Standard (2013-04-26 16:27:45)   Cyproheptadine tablets What is this medicine? CYPROHEPTADINE (si proe HEP ta deen) is a antihistamine. This medicine is used to treat allergy symptoms. It is can help stop runny nose, watery eyes, and itchy rash. This medicine may be used for other purposes; ask your health care provider or pharmacist if you have questions. COMMON BRAND NAME(S): Periactin What should I tell my health care provider before I take this medicine? They need to know if you have any of these conditions: -any chronic disease -glaucoma -prostate disease -ulcers or other stomach problems -an unusual or allergic reaction to cyproheptadine, other medicines foods, dyes, or preservatives -pregnant or trying to get pregnant -breast-feeding How should I use this medicine? Take this medicine by mouth with a glass of water. Follow the directions on the prescription label. Take your doses at regular intervals. Do not take your medicine more often than directed. Talk to your pediatrician regarding the use of this medicine in children. While this drug may be prescribed for children as young as 542 years of age for selected conditions, precautions do apply. Overdosage: If you think you have taken too much of this medicine contact a poison control center or emergency room at once. NOTE: This medicine is only for you. Do not share this medicine with others. What if I miss a dose? If you miss a dose, take it as soon as you can. If it is almost time for your next dose, take only that dose. Do not take double or extra doses. What may interact with this medicine? Do not take this medicine with any of the following medications: -MAOIs like Carbex, Eldepryl, Marplan, Nardil, and Parnate This medicine may also interact with the following medications: -alcohol -barbiturate medicines for inducing sleep or treating seizures -medicines for depression, anxiety or psychotic  disturbances -medicines for movement abnormalities -medicines for sleep -medicines for stomach problems -some medicines for cold or allergies This list may not describe all possible interactions. Give your health care provider a list of all the medicines, herbs, non-prescription drugs, or dietary supplements you use. Also tell them if you smoke, drink alcohol, or use illegal drugs. Some items may interact with your medicine. What should I watch for while using this medicine? Visit your doctor or health care professional for regular check ups. Tell your doctor if your symptoms do not improve or if they get worse. You may get drowsy or dizzy. Do not drive, use machinery, or do anything that needs mental alertness until you know how this medicine affects you. Do not stand or sit up quickly, especially if you are an older patient. This reduces the risk of dizzy or fainting spells. Alcohol may interfere with the effect of this medicine. Avoid alcoholic drinks. Your mouth may get dry. Chewing sugarless gum or sucking hard candy, and drinking plenty of water may help. Contact your doctor if the problem does not go away or is severe. This medicine may cause dry eyes and blurred vision. If you wear contact lenses you may feel some discomfort. Lubricating drops may help. See your eye doctor if the problem does not go away or is severe. This medicine can make you more sensitive to the sun. Keep out of the sun. If you cannot avoid being in the sun, wear protective clothing and use sunscreen. Do not use sun lamps or tanning beds/booths. What side effects may I notice from receiving this medicine? Side effects that you  should report to your doctor or health care professional as soon as possible: -allergic reactions like skin rash, itching or hives, swelling of the face, lips, or tongue -agitation, nervousness, excitability, not able to sleep -chest pain -irregular, fast heartbeat -pain or difficulty passing  urine -seizures -unusual bleeding or bruising -unusually weak or tired -yellowing of the eyes or skin Side effects that usually do not require medical attention (report to your doctor or health care professional if they continue or are bothersome): -constipation or diarrhea -headache -loss of appetite -nausea, vomiting -stomach upset -weight gain This list may not describe all possible side effects. Call your doctor for medical advice about side effects. You may report side effects to FDA at 1-800-FDA-1088. Where should I keep my medicine? Keep out of the reach of children. Store at room temperature between 15 and 30 degrees C (59 and 86 degrees F). Keep container tightly closed. Throw away any unused medicine after the expiration date. NOTE: This sheet is a summary. It may not cover all possible information. If you have questions about this medicine, talk to your doctor, pharmacist, or health care provider.  2018 Elsevier/Gold Standard (2007-10-24 16:29:53)     Migraine Headache A migraine headache is an intense, throbbing pain on one side or both sides of the head. Migraines may also cause other symptoms, such as nausea, vomiting, and sensitivity to light and noise. What are the causes? Doing or taking certain things may also trigger migraines, such as:  Alcohol.  Smoking.  Medicines, such as: ? Medicine used to treat chest pain (nitroglycerine). ? Birth control pills. ? Estrogen pills. ? Certain blood pressure medicines.  Aged cheeses, chocolate, or caffeine.  Foods or drinks that contain nitrates, glutamate, aspartame, or tyramine.  Physical activity.  Other things that may trigger a migraine include:  Menstruation.  Pregnancy.  Hunger.  Stress, lack of sleep, too much sleep, or fatigue.  Weather changes.  What increases the risk? The following factors may make you more likely to experience migraine headaches:  Age. Risk increases with age.  Family  history of migraine headaches.  Being Caucasian.  Depression and anxiety.  Obesity.  Being a woman.  Having a hole in the heart (patent foramen ovale) or other heart problems.  What are the signs or symptoms? The main symptom of this condition is pulsating or throbbing pain. Pain may:  Happen in any area of the head, such as on one side or both sides.  Interfere with daily activities.  Get worse with physical activity.  Get worse with exposure to bright lights or loud noises.  Other symptoms may include:  Nausea.  Vomiting.  Dizziness.  General sensitivity to bright lights, loud noises, or smells.  Before you get a migraine, you may get warning signs that a migraine is developing (aura). An aura may include:  Seeing flashing lights or having blind spots.  Seeing bright spots, halos, or zigzag lines.  Having tunnel vision or blurred vision.  Having numbness or a tingling feeling.  Having trouble talking.  Having muscle weakness.  How is this diagnosed? A migraine headache can be diagnosed based on:  Your symptoms.  A physical exam.  Tests, such as CT scan or MRI of the head. These imaging tests can help rule out other causes of headaches.  Taking fluid from the spine (lumbar puncture) and analyzing it (cerebrospinal fluid analysis, or CSF analysis).  How is this treated? A migraine headache is usually treated with medicines that:  Relieve  pain.  Relieve nausea.  Prevent migraines from coming back.  Treatment may also include:  Acupuncture.  Lifestyle changes like avoiding foods that trigger migraines.  Follow these instructions at home: Medicines  Take over-the-counter and prescription medicines only as told by your health care provider.  Do not drive or use heavy machinery while taking prescription pain medicine.  To prevent or treat constipation while you are taking prescription pain medicine, your health care provider may recommend  that you: ? Drink enough fluid to keep your urine clear or pale yellow. ? Take over-the-counter or prescription medicines. ? Eat foods that are high in fiber, such as fresh fruits and vegetables, whole grains, and beans. ? Limit foods that are high in fat and processed sugars, such as fried and sweet foods. Lifestyle  Avoid alcohol use.  Do not use any products that contain nicotine or tobacco, such as cigarettes and e-cigarettes. If you need help quitting, ask your health care provider.  Get at least 8 hours of sleep every night.  Limit your stress. General instructions   Keep a journal to find out what may trigger your migraine headaches. For example, write down: ? What you eat and drink. ? How much sleep you get. ? Any change to your diet or medicines.  If you have a migraine: ? Avoid things that make your symptoms worse, such as bright lights. ? It may help to lie down in a dark, quiet room. ? Do not drive or use heavy machinery. ? Ask your health care provider what activities are safe for you while you are experiencing symptoms.  Keep all follow-up visits as told by your health care provider. This is important. Contact a health care provider if:  You develop symptoms that are different or more severe than your usual migraine symptoms. Get help right away if:  Your migraine becomes severe.  You have a fever.  You have a stiff neck.  You have vision loss.  Your muscles feel weak or like you cannot control them.  You start to lose your balance often.  You develop trouble walking.  You faint. This information is not intended to replace advice given to you by your health care provider. Make sure you discuss any questions you have with your health care provider. Document Released: 07/20/2005 Document Revised: 02/07/2016 Document Reviewed: 01/06/2016 Elsevier Interactive Patient Education  2017 [employer]

## 2017-07-20 NOTE — Progress Notes (Signed)
[facility]    Provider:  Dr Lucia GaskinsAhern  CC:  migraines  HPI:  Beth English is a 26 y.o. female here as a referral from pcp for migraines. She has a hx of migraines. In HS and college she would get migraines, light sensitivity, sleeping and a dark room helped, always on the top and pounding/throbbing some nausea and worse with movement, no aura. She hadn't had a migraine in years. On 12/12 she started having a migraine behind the left eye, felt like a "regular headache" which is common in pregnancy. Around 3pm she was looking at her phone and she lost her right peripheral vision, then a few minutes went by and she couldn;t type, then felt her speech was impaired, then in the car right arm and tongue numbness, headache was throbbing, migraine was worsening. She still has some residual headache but the other symptoms have resolved. She has had a little dizziness. No other focal neurologic deficits, associated symptoms, inciting events or modifiable factors.  Reviewed notes, labs and imaging from outside physicians, which showed:  Reviewed physicians notes, patient was seen in the emergency room December 12 of this year for strokelike symptoms.  She is [redacted] weeks pregnant.  She first noticed difficulty moving her right hand along with a peripheral visual defect in the lower right visual field out of the right eye.  She developed worsening numbness in the hand and tongue.  She had difficulty with word finding, dysarthric speech, she was unable to type correct words.  This was in the setting of a migraine.  She has had headaches throughout her pregnancy which were similar.  She also has a history of migraine headaches.  She never had abnormal neurologic deficits.  Symptoms resolved in the emergency room.  When she lost peripheral vision in her right eye it started with squiggly lines as well as right hemisensory loss.  Migraines worsen.  No history of seizure disorder or strokes in the  past  Personally reviewed MRI of the brain and MRV of the head images which were both normal.  Review of Systems: Patient complains of symptoms per HPI as well as the following symptoms: headache, numbness, weakness, SOB, fatigue, dizziness, anxiety. Pertinent negatives and positives per HPI. All others negative.   Social History   Socioeconomic History  . Marital status: Single    Spouse name: Not on file  . Number of children: 0  . Years of education: Not on file  . Highest education level: Bachelor's degree (e.g., BA, AB, BS)  Social Needs  . Financial resource strain: Not on file  . Food insecurity - worry: Not on file  . Food insecurity - inability: Not on file  . Transportation needs - medical: Not on file  . Transportation needs - non-medical: Not on file  Occupational History  . Not on file  Tobacco Use  . Smoking status: Never Smoker  . Smokeless tobacco: Never Used  Substance and Sexual Activity  . Alcohol use: No    Frequency: Never    Comment: quit 06/2017 due to pregnancy  . Drug use: No  . Sexual activity: Yes  Other Topics Concern  . Not on file  Social History Narrative   Iced coffee about once a month, very rare   Lives at home with her fiance   Left handed     Family History  Problem Relation Age of Onset  . Hypertension Mother   . Diabetes Other   . Heart disease Maternal  Grandmother   . Diabetes Maternal Grandmother   . Heart disease Maternal Grandfather   . Lung cancer Maternal Grandfather   . Diabetes Maternal Aunt   . Diabetes Maternal Aunt     Past Medical History:  Diagnosis Date  . Anxiety   . Migraine     Past Surgical History:  Procedure Laterality Date  . TONSILLECTOMY  1998  . TYMPANOSTOMY TUBE PLACEMENT    . WISDOM TOOTH EXTRACTION      Current Outpatient Medications  Medication Sig Dispense Refill  . acetaminophen (TYLENOL) 500 MG tablet Take 1 tablet (500 mg total) by mouth every 6 (six) hours as needed. 30 tablet 0   . Prenatal Multivit-Min-Fe-FA (PRENATAL VITAMINS PO) Take 1 tablet by mouth daily.    . cyproheptadine (PERIACTIN) 4 MG tablet Take 1 tablet (4 mg total) by mouth 3 (three) times daily as needed. 90 tablet 3  . ondansetron (ZOFRAN) 4 MG tablet Take 1 tablet (4 mg total) by mouth every 8 (eight) hours as needed for nausea or vomiting. 20 tablet 0   No current facility-administered medications for this visit.     Allergies as of 07/20/2017 - Review Complete 07/20/2017  Allergen Reaction Noted  . Zithromax [azithromycin] Other (See Comments) 01/19/2013  . Bactrim [sulfamethoxazole-trimethoprim] Rash 01/19/2013    Vitals: BP 124/77 (BP Location: Right Arm, Patient Position: Sitting)   Pulse 83   Ht 5\' 9"  (1.753 m)   Wt 270 lb (122.5 kg)   BMI 39.87 kg/m  Last Weight:  Wt Readings from Last 1 Encounters:  07/20/17 270 lb (122.5 kg)   Last Height:   Ht Readings from Last 1 Encounters:  07/20/17 5\' 9"  (1.753 m)     Physical exam: Exam: Gen: NAD, conversant, well nourised, obese, well groomed                     CV: RRR, no MRG. No Carotid Bruits. No peripheral edema, warm, nontender Eyes: Conjunctivae clear without exudates or hemorrhage  Neuro: Detailed Neurologic Exam  Speech:    Speech is normal; fluent and spontaneous with normal comprehension.  Cognition:    The patient is oriented to person, place, and time;     recent and remote memory intact;     language fluent;     normal attention, concentration,     fund of knowledge Cranial Nerves:    The pupils are equal, round, and reactive to light. The fundi are normal and spontaneous venous pulsations are present. Visual fields are full to finger confrontation. Extraocular movements are intact. Trigeminal sensation is intact and the muscles of mastication are normal. The face is symmetric. The palate elevates in the midline. Hearing intact. Voice is normal. Shoulder shrug is normal. The tongue has normal motion without  fasciculations.   Coordination:    Normal finger to nose and heel to shin. Normal rapid alternating movements.   Gait:    Heel-toe and tandem gait are normal.   Motor Observation:    No asymmetry, no atrophy, and no involuntary movements noted. Tone:    Normal muscle tone.    Posture:    Posture is normal. normal erect    Strength:    Strength is V/V in the upper and lower limbs.      Sensation: intact to LT     Reflex Exam:  DTR's:    Deep tendon reflexes in the upper and lower extremities are normal bilaterally.   Toes:  The toes are downgoing bilaterally.   Clonus:    Clonus is absent.     Assessment/Plan:  26 year old with PMHx migraines with an episode of hemiplegic migraines, resolved. MRI and MRV normal.   At onset of migraine: Tylenol 1000mg  and Zofran and can also try periactin  Discussed: To prevent or relieve headaches, try the following: Cool Compress. Lie down and place a cool compress on your head.  Avoid headache triggers. If certain foods or odors seem to have triggered your migraines in the past, avoid them. A headache diary might help you identify triggers.  Include physical activity in your daily routine. Try a daily walk or other moderate aerobic exercise.  Manage stress. Find healthy ways to cope with the stressors, such as delegating tasks on your to-do list.  Practice relaxation techniques. Try deep breathing, yoga, massage and visualization.  Eat regularly. Eating regularly scheduled meals and maintaining a healthy diet might help prevent headaches. Also, drink plenty of fluids.  Follow a regular sleep schedule. Sleep deprivation might contribute to headaches Consider biofeedback. With this mind-body technique, you learn to control certain bodily functions - such as muscle tension, heart rate and blood pressure - to prevent headaches or reduce headache pain.    Proceed to emergency room if you experience new or worsening symptoms or symptoms do  not resolve, if you have new neurologic symptoms or if headache is severe, or for any concerning symptom.   Provided education and documentation from American headache Society toolbox including articles on: chronic migraine medication overuse headache, chronic migraines, prevention of migraines, behavioral and other nonpharmacologic treatments for headache.     Naomie DeanAntonia Genette Huertas, MD  [facility] [address] [place], [place] [address]  Phone [phone] Fax [phone]

## 2017-12-20 LAB — OB RESULTS CONSOLE GBS: GBS: POSITIVE

## 2017-12-22 ENCOUNTER — Encounter (HOSPITAL_COMMUNITY): Payer: Self-pay | Admitting: *Deleted

## 2017-12-22 ENCOUNTER — Inpatient Hospital Stay (HOSPITAL_COMMUNITY)
Admission: AD | Admit: 2017-12-22 | Discharge: 2017-12-22 | Disposition: A | Discharge status: Home / Self Care | Payer: 59 | Source: Ambulatory Visit | Attending: Obstetrics and Gynecology | Admitting: Obstetrics and Gynecology

## 2017-12-22 DIAGNOSIS — Z3A35 35 weeks gestation of pregnancy: Secondary | ICD-10-CM | POA: Insufficient documentation

## 2017-12-22 DIAGNOSIS — O26893 Other specified pregnancy related conditions, third trimester: Secondary | ICD-10-CM | POA: Diagnosis not present

## 2017-12-22 DIAGNOSIS — R030 Elevated blood-pressure reading, without diagnosis of hypertension: Secondary | ICD-10-CM | POA: Insufficient documentation

## 2017-12-22 DIAGNOSIS — O163 Unspecified maternal hypertension, third trimester: Secondary | ICD-10-CM | POA: Diagnosis present

## 2017-12-22 DIAGNOSIS — O26853 Spotting complicating pregnancy, third trimester: Secondary | ICD-10-CM | POA: Diagnosis present

## 2017-12-22 LAB — COMPREHENSIVE METABOLIC PANEL
ALT: 11 U/L — ABNORMAL LOW (ref 14–54)
AST: 17 U/L (ref 15–41)
Albumin: 3 g/dL — ABNORMAL LOW (ref 3.5–5.0)
Alkaline Phosphatase: 81 U/L (ref 38–126)
Anion gap: 11 (ref 5–15)
BILIRUBIN TOTAL: 0.1 mg/dL — AB (ref 0.3–1.2)
BUN: 9 mg/dL (ref 6–20)
CHLORIDE: 105 mmol/L (ref 101–111)
CO2: 19 mmol/L — ABNORMAL LOW (ref 22–32)
Calcium: 8.6 mg/dL — ABNORMAL LOW (ref 8.9–10.3)
Creatinine, Ser: 0.54 mg/dL (ref 0.44–1.00)
Glucose, Bld: 89 mg/dL (ref 65–99)
POTASSIUM: 3.8 mmol/L (ref 3.5–5.1)
Sodium: 135 mmol/L (ref 135–145)
TOTAL PROTEIN: 6.8 g/dL (ref 6.5–8.1)

## 2017-12-22 LAB — PROTEIN / CREATININE RATIO, URINE
CREATININE, URINE: 303 mg/dL
Protein Creatinine Ratio: 0.08 mg/mg{Cre} (ref 0.00–0.15)
Total Protein, Urine: 24 mg/dL

## 2017-12-22 LAB — CBC
HEMATOCRIT: 35.4 % — AB (ref 36.0–46.0)
Hemoglobin: 11.7 g/dL — ABNORMAL LOW (ref 12.0–15.0)
MCH: 28.1 pg (ref 26.0–34.0)
MCHC: 33.1 g/dL (ref 30.0–36.0)
MCV: 84.9 fL (ref 78.0–100.0)
PLATELETS: 200 10*3/uL (ref 150–400)
RBC: 4.17 MIL/uL (ref 3.87–5.11)
RDW: 13.9 % (ref 11.5–15.5)
WBC: 13.2 10*3/uL — AB (ref 4.0–10.5)

## 2017-12-22 MED ORDER — HYDRALAZINE HCL 20 MG/ML IJ SOLN: 10.0000 mg | Freq: Once | INTRAMUSCULAR | Status: DC | PRN

## 2017-12-22 MED ORDER — LACTATED RINGERS IV SOLN
INTRAVENOUS | Status: DC
  Administered 2017-12-22: 10:00:00 via INTRAVENOUS

## 2017-12-22 MED ORDER — LABETALOL HCL 5 MG/ML IV SOLN
20.0000 mg | INTRAVENOUS | Status: DC | PRN
  Administered 2017-12-22: 20 mg via INTRAVENOUS
  Filled 2017-12-22: qty 4

## 2017-12-22 NOTE — MAU Provider Note (Signed)
History     CSN: [id number]  Arrival date and time: 12/22/17 4098   First Provider Initiated Contact with Patient 12/22/17 [phone]      Chief Complaint  Patient presents with  . Abdominal Pain  . Vaginal Bleeding  . Rupture of Membranes   HPI  Ms.  Beth English is a 27 y.o. year old G1P0 female at [redacted]w[redacted]d weeks gestation who presents to MAU reporting she was seen for her OB visit on Monday 5/20. She states that her cervix was checked and she was a 1.5 cm by Dr. Elon Spanner. She ws told to expect spotting or light bleeding She states she had "a small amt of VB, but it stopped later". She then reports having some brown VB yesterday with wiping. She wore a pad for the rest of the day and it would "get wet at times". Today she started having bloody mucous, passing small "bloody snot-looking balls or clots, ? Leaking and UC's like menstrual cramps. She reports good (+) FM today.   Past Medical History:  Diagnosis Date  . Anxiety   . Migraine    migraines    Past Surgical History:  Procedure Laterality Date  . TONSILLECTOMY  1998  . TYMPANOSTOMY TUBE PLACEMENT    . WISDOM TOOTH EXTRACTION      Family History  Problem Relation Age of Onset  . Hypertension Mother   . Diabetes Other   . Heart disease Maternal Grandmother   . Diabetes Maternal Grandmother   . Heart disease Maternal Grandfather   . Lung cancer Maternal Grandfather   . Diabetes Maternal Aunt   . Diabetes Maternal Aunt     Social History   Tobacco Use  . Smoking status: Never Smoker  . Smokeless tobacco: Never Used  Substance Use Topics  . Alcohol use: No    Frequency: Never    Comment: quit 06/2017 due to pregnancy  . Drug use: No    Allergies:  Allergies  Allergen Reactions  . Bactrim [Sulfamethoxazole-Trimethoprim] Rash    Childhood reaction  . Zithromax [Azithromycin] Rash and Other (See Comments)    Childhood reaction.Throwing up    No medications prior to admission.    Review of Systems   Constitutional: Negative.   HENT: Negative.   Eyes: Negative.   Respiratory: Negative.   Cardiovascular: Negative.   Gastrointestinal: Negative.   Endocrine: Negative.   Genitourinary: Positive for vaginal bleeding ("musousy, brown clots and a lot more of it").       "some UC's that started this morning"  Musculoskeletal: Negative.   Skin: Negative.   Allergic/Immunologic: Negative.   Neurological: Negative.   Hematological: Negative.   Psychiatric/Behavioral: Negative.    Physical Exam   Patient Vitals for the past 24 hrs:  BP Temp Temp src Pulse Resp Height Weight  12/22/17 1115 132/83 - - 80 - - -  12/22/17 1100 129/85 - - 81 - - -  12/22/17 1045 128/81 - - 82 - - -  12/22/17 1030 126/75 - - 80 - - -  12/22/17 1023 132/81 - - 81 - - -  12/22/17 1001 (!) 156/95 - - 86 - - -  12/22/17 0947 (!) 160/95 - - 88 - - -  12/22/17 0934 (!) 158/101 - - 84 - - -  12/22/17 0931 (!) 149/93 - - 83 - - -  12/22/17 0916 (!) 147/87 - - (!) 102 - - -  12/22/17 0903 138/89 97.6 F (36.4 C) Oral 88 20 - -  12/22/17 0850 - - - - -  (1.778 m) 296 lb 1.9 oz (134.3 kg)    Physical Exam  Nursing note and vitals reviewed. Constitutional: She is oriented to person, place, and time. She appears well-developed and well-nourished.  HENT:  Head: Normocephalic and atraumatic.  Eyes: Pupils are equal, round, and reactive to light.  Neck: Normal range of motion.  Cardiovascular: Normal rate, regular rhythm and normal heart sounds.  Respiratory: Effort normal and breath sounds normal.  GI: Soft. Bowel sounds are normal.  Genitourinary:  Genitourinary Comments: Uterus: gravid, S=D, SE: cervix is smooth, pink, no lesions, small amt of thick, brown mucous with blood tinged streaks. SVE: 1.5/70%/-2/vtx  Musculoskeletal: Normal range of motion.  Neurological: She is alert and oriented to person, place, and time. She has normal reflexes.  Skin: Skin is warm and dry.  Psychiatric: She has a normal  mood and affect. Her behavior is normal. Judgment and thought content normal.    MAU Course  Procedures  MDM CBC CMP P/C Ratio Labetalol Protocol -- 1 dose of Labetalol given improved BPs  NST - FHR: 125 bpm / moderate variability / accels present / decels absent / TOCO: UI noted with occ UC's  *Consult with Dr. Henderson Cloud @ 1130 - notified of patient's complaints, assessments, & lab results, recommended tx plan d/c home, F/U BP re-check on 5/23   Results for orders placed or performed during the hospital encounter of 12/22/17 (from the past 24 hour(s))  CBC     Status: Abnormal   Collection Time: 12/22/17  9:55 AM  Result Value Ref Range   WBC 13.2 (H) 4.0 - 10.5 K/uL   RBC 4.17 3.87 - 5.11 MIL/uL   Hemoglobin 11.7 (L) 12.0 - 15.0 g/dL   HCT 16.1 (L) 09.6 - 04.5 %   MCV 84.9 78.0 - 100.0 fL   MCH 28.1 26.0 - 34.0 pg   MCHC 33.1 30.0 - 36.0 g/dL   RDW 40.9 81.1 - 91.4 %   Platelets 200 150 - 400 K/uL  Comprehensive metabolic panel     Status: Abnormal   Collection Time: 12/22/17  9:55 AM  Result Value Ref Range   Sodium 135 135 - 145 mmol/L   Potassium 3.8 3.5 - 5.1 mmol/L   Chloride 105 101 - 111 mmol/L   CO2 19 (L) 22 - 32 mmol/L   Glucose, Bld 89 65 - 99 mg/dL   BUN 9 6 - 20 mg/dL   Creatinine, Ser 7.82 0.44 - 1.00 mg/dL   Calcium 8.6 (L) 8.9 - 10.3 mg/dL   Total Protein 6.8 6.5 - 8.1 g/dL   Albumin 3.0 (L) 3.5 - 5.0 g/dL   AST 17 15 - 41 U/L   ALT 11 (L) 14 - 54 U/L   Alkaline Phosphatase 81 38 - 126 U/L   Total Bilirubin 0.1 (L) 0.3 - 1.2 mg/dL   GFR calc non Af Amer >60 >60 mL/min   GFR calc Af Amer >60 >60 mL/min   Anion gap 11 5 - 15  Protein / creatinine ratio, urine     Status: None   Collection Time: 12/22/17  9:55 AM  Result Value Ref Range   Creatinine, Urine 303.00 mg/dL   Total Protein, Urine 24 mg/dL   Protein Creatinine Ratio 0.08 0.00 - 0.15 mg/mg[Cre]    Assessment and Plan  Elevated blood pressure affecting pregnancy in third trimester,  antepartum  - Information provided on PEC, gHTN - BP re-check in office  on 12/23/2017 - Advised to call [facility] to schedule BP re-check, if not contacted by 1500 today  Spotting affecting pregnancy in third trimester  - Reassurance given that there was no evidence of leaking fluid and mucoid spotting seen is normal - Bleeding precautions reviewed - PTL precautions reviewed - Discharge patient - Patient verbalized an understanding of the plan of care and agrees.     Raelyn Mora, MSN, CNM 12/22/2017, 9:21 AM

## 2017-12-22 NOTE — MAU Note (Addendum)
SVE on Monday, had small amount of bleeding, it stopped later.  Had brown ? Bleeding yesterday with wiping.  Wore a pad the rest of the day, pad was wet @ times.  Today having bloody mucus, passing small clots, thinks she may be leaking.  Contractions started this morning but are sporadic.  Reports good fetal movement.

## 2017-12-29 ENCOUNTER — Encounter (HOSPITAL_COMMUNITY): Payer: Self-pay | Admitting: *Deleted

## 2017-12-29 ENCOUNTER — Telehealth (HOSPITAL_COMMUNITY): Payer: Self-pay | Admitting: *Deleted

## 2017-12-29 NOTE — Telephone Encounter (Signed)
Preadmission screen  

## 2018-01-04 ENCOUNTER — Telehealth (HOSPITAL_COMMUNITY): Payer: Self-pay | Admitting: *Deleted

## 2018-01-04 NOTE — Telephone Encounter (Signed)
Preadmission screen  

## 2018-01-06 ENCOUNTER — Telehealth (HOSPITAL_COMMUNITY): Payer: Self-pay | Admitting: *Deleted

## 2018-01-06 ENCOUNTER — Encounter (HOSPITAL_COMMUNITY): Payer: Self-pay | Admitting: *Deleted

## 2018-01-06 NOTE — Telephone Encounter (Signed)
Preadmission screen  

## 2018-01-06 NOTE — H&P (Signed)
Beth English is a 27 y.o. female presenting for IOL. BPs in office 142/92-94. Labs 12/28/17 normal. OB History    Gravida  1   Para      Term      Preterm      AB      Living        SAB      TAB      Ectopic      Multiple      Live Births             Past Medical History:  Diagnosis Date  . Anxiety   . Depression    meds x 4 years  . Hx of pyelonephritis   . Migraine    migraines  . Pregnancy induced hypertension    Past Surgical History:  Procedure Laterality Date  . TONSILLECTOMY  1998  . TYMPANOSTOMY TUBE PLACEMENT    . WISDOM TOOTH EXTRACTION     Family History: family history includes COPD in her mother; Deep vein thrombosis in her mother; Diabetes in her maternal aunt, maternal aunt, maternal grandmother, and other; Heart disease in her maternal grandfather and maternal grandmother; Heart failure in her maternal grandmother; Hypertension in her mother; Lung cancer in her maternal grandfather; Rheum arthritis in her maternal aunt and mother. Social History:  reports that she has never smoked. She has never used smokeless tobacco. She reports that she does not drink alcohol or use drugs.     Maternal Diabetes: No Genetic Screening: Normal Maternal Ultrasounds/Referrals: Normal Fetal Ultrasounds or other Referrals:  None Maternal Substance Abuse:  No Significant Maternal Medications:  None Significant Maternal Lab Results:  None Other Comments:  None  Review of Systems  Eyes: Negative for blurred vision.  Gastrointestinal: Negative for abdominal pain.   History   There were no vitals taken for this visit. Maternal Exam:  Abdomen: Fetal presentation: vertex     Physical Exam  Cardiovascular: Normal rate and regular rhythm.  Respiratory: Effort normal and breath sounds normal.  GI: Soft. There is no tenderness.  Neurological: She has normal reflexes.    Cx 3/75/vtx on 01/04/18  U/S EFW 5# 9oz, vtx AFI 58% on 12/28/17  Prenatal  labs: ABO, Rh: O/Positive/-- (11/29 0000) Antibody: Negative (11/29 0000) Rubella: Immune (11/29 0000) RPR: Nonreactive (11/29 0000)  HBsAg: Negative (11/29 0000)  HIV: Non-reactive (11/29 0000)  GBS:     Assessment/Plan: 27 yo G1P0 with gestational hypertension for IOL GBBS + > atb per protocol   Roselle LocusJames E Melvinia Ashby II 01/06/2018, 2:22 PM

## 2018-01-07 ENCOUNTER — Inpatient Hospital Stay (HOSPITAL_COMMUNITY): Payer: 59 | Admitting: Anesthesiology

## 2018-01-07 ENCOUNTER — Encounter (HOSPITAL_COMMUNITY): Payer: Self-pay

## 2018-01-07 ENCOUNTER — Other Ambulatory Visit: Payer: Self-pay

## 2018-01-07 ENCOUNTER — Inpatient Hospital Stay (HOSPITAL_COMMUNITY)
Admission: RE | Admit: 2018-01-07 | Discharge: 2018-01-09 | DRG: 807 | Disposition: A | Discharge status: Home / Self Care | Payer: 59 | Attending: Obstetrics and Gynecology | Admitting: Obstetrics and Gynecology

## 2018-01-07 ENCOUNTER — Inpatient Hospital Stay (HOSPITAL_COMMUNITY): Admission: RE | Admit: 2018-01-07 | Payer: 59 | Source: Ambulatory Visit

## 2018-01-07 DIAGNOSIS — O26853 Spotting complicating pregnancy, third trimester: Secondary | ICD-10-CM

## 2018-01-07 DIAGNOSIS — Z3A38 38 weeks gestation of pregnancy: Secondary | ICD-10-CM

## 2018-01-07 DIAGNOSIS — O99214 Obesity complicating childbirth: Secondary | ICD-10-CM | POA: Diagnosis present

## 2018-01-07 DIAGNOSIS — O134 Gestational [pregnancy-induced] hypertension without significant proteinuria, complicating childbirth: Secondary | ICD-10-CM | POA: Diagnosis present

## 2018-01-07 DIAGNOSIS — O99824 Streptococcus B carrier state complicating childbirth: Secondary | ICD-10-CM | POA: Diagnosis present

## 2018-01-07 DIAGNOSIS — O139 Gestational [pregnancy-induced] hypertension without significant proteinuria, unspecified trimester: Secondary | ICD-10-CM | POA: Diagnosis present

## 2018-01-07 DIAGNOSIS — O163 Unspecified maternal hypertension, third trimester: Secondary | ICD-10-CM

## 2018-01-07 LAB — COMPREHENSIVE METABOLIC PANEL
ALBUMIN: 2.9 g/dL — AB (ref 3.5–5.0)
ALBUMIN: 2.7 g/dL — AB (ref 3.5–5.0)
ALT: 11 U/L — ABNORMAL LOW (ref 14–54)
ALT: 12 U/L — ABNORMAL LOW (ref 14–54)
ALT: 11 U/L — ABNORMAL LOW (ref 14–54)
ANION GAP: 13 (ref 5–15)
ANION GAP: 11 (ref 5–15)
AST: 19 U/L (ref 15–41)
AST: 18 U/L (ref 15–41)
AST: 22 U/L (ref 15–41)
Albumin: 3.2 g/dL — ABNORMAL LOW (ref 3.5–5.0)
Alkaline Phosphatase: 96 U/L (ref 38–126)
Alkaline Phosphatase: 96 U/L (ref 38–126)
Alkaline Phosphatase: 94 U/L (ref 38–126)
Anion gap: 12 (ref 5–15)
BILIRUBIN TOTAL: 0.4 mg/dL (ref 0.3–1.2)
BILIRUBIN TOTAL: 0.5 mg/dL (ref 0.3–1.2)
BUN: 13 mg/dL (ref 6–20)
BUN: 12 mg/dL (ref 6–20)
BUN: 12 mg/dL (ref 6–20)
CHLORIDE: 106 mmol/L (ref 101–111)
CO2: 19 mmol/L — AB (ref 22–32)
CO2: 21 mmol/L — ABNORMAL LOW (ref 22–32)
CO2: 18 mmol/L — ABNORMAL LOW (ref 22–32)
Calcium: 9.3 mg/dL (ref 8.9–10.3)
Calcium: 9.5 mg/dL (ref 8.9–10.3)
Calcium: 8.9 mg/dL (ref 8.9–10.3)
Chloride: 105 mmol/L (ref 101–111)
Chloride: 106 mmol/L (ref 101–111)
Creatinine, Ser: 0.7 mg/dL (ref 0.44–1.00)
Creatinine, Ser: 0.76 mg/dL (ref 0.44–1.00)
Creatinine, Ser: 0.7 mg/dL (ref 0.44–1.00)
GFR calc Af Amer: >60 mL/min (ref >60)
GFR calc Af Amer: >60 mL/min (ref >60)
GFR calc Af Amer: >60 mL/min (ref >60)
GFR calc non Af Amer: >60 mL/min (ref >60)
GFR calc non Af Amer: >60 mL/min (ref >60)
GLUCOSE: 81 mg/dL (ref 65–99)
Glucose, Bld: 82 mg/dL (ref 65–99)
Glucose, Bld: 114 mg/dL — ABNORMAL HIGH (ref 65–99)
POTASSIUM: 3.9 mmol/L (ref 3.5–5.1)
POTASSIUM: 4 mmol/L (ref 3.5–5.1)
Potassium: 4.9 mmol/L (ref 3.5–5.1)
SODIUM: 137 mmol/L (ref 135–145)
Sodium: 139 mmol/L (ref 135–145)
Sodium: 135 mmol/L (ref 135–145)
TOTAL PROTEIN: 5.8 g/dL — AB (ref 6.5–8.1)
Total Bilirubin: 0.2 mg/dL — ABNORMAL LOW (ref 0.3–1.2)
Total Protein: 6.1 g/dL — ABNORMAL LOW (ref 6.5–8.1)
Total Protein: 7 g/dL (ref 6.5–8.1)

## 2018-01-07 LAB — CBC
HCT: 36.2 % (ref 36.0–46.0)
HEMATOCRIT: 35.6 % — AB (ref 36.0–46.0)
HEMATOCRIT: 35.4 % — AB (ref 36.0–46.0)
HEMOGLOBIN: 12.1 g/dL (ref 12.0–15.0)
Hemoglobin: 12.4 g/dL (ref 12.0–15.0)
Hemoglobin: 11.9 g/dL — ABNORMAL LOW (ref 12.0–15.0)
MCH: 28.1 pg (ref 26.0–34.0)
MCH: 28.6 pg (ref 26.0–34.0)
MCH: 28.1 pg (ref 26.0–34.0)
MCHC: 34 g/dL (ref 30.0–36.0)
MCHC: 34.3 g/dL (ref 30.0–36.0)
MCHC: 33.6 g/dL (ref 30.0–36.0)
MCV: 82.8 fL (ref 78.0–100.0)
MCV: 83.6 fL (ref 78.0–100.0)
MCV: 83.7 fL (ref 78.0–100.0)
PLATELETS: 176 10*3/uL (ref 150–400)
Platelets: 180 10*3/uL (ref 150–400)
Platelets: 183 10*3/uL (ref 150–400)
RBC: 4.3 MIL/uL (ref 3.87–5.11)
RBC: 4.33 MIL/uL (ref 3.87–5.11)
RBC: 4.23 MIL/uL (ref 3.87–5.11)
RDW: 14.6 % (ref 11.5–15.5)
RDW: 14.2 % (ref 11.5–15.5)
RDW: 14.3 % (ref 11.5–15.5)
WBC: 12.4 10*3/uL — ABNORMAL HIGH (ref 4.0–10.5)
WBC: 15.3 10*3/uL — ABNORMAL HIGH (ref 4.0–10.5)
WBC: 23.2 10*3/uL — AB (ref 4.0–10.5)

## 2018-01-07 LAB — TYPE AND SCREEN
ABO/RH(D): O POS
Antibody Screen: NEGATIVE

## 2018-01-07 LAB — ABO/RH: ABO/RH(D): O POS

## 2018-01-07 MED ORDER — OXYTOCIN 40 UNITS IN LACTATED RINGERS INFUSION - SIMPLE MED
2.5000 [IU]/h | INTRAVENOUS | Status: DC
  Filled 2018-01-07: qty 1000

## 2018-01-07 MED ORDER — PHENYLEPHRINE 40 MCG/ML (10ML) SYRINGE FOR IV PUSH (FOR BLOOD PRESSURE SUPPORT)
80.0000 ug | PREFILLED_SYRINGE | INTRAVENOUS | Status: DC | PRN
  Filled 2018-01-07: qty 5
  Filled 2018-01-07: qty 10

## 2018-01-07 MED ORDER — ONDANSETRON HCL 4 MG/2ML IJ SOLN: 4.0000 mg | Freq: Four times a day (QID) | INTRAMUSCULAR | Status: DC | PRN

## 2018-01-07 MED ORDER — OXYCODONE HCL 5 MG PO TABS
10.0000 mg | ORAL_TABLET | ORAL | Status: DC | PRN
  Filled 2018-01-07: qty 2

## 2018-01-07 MED ORDER — LACTATED RINGERS IV SOLN
500.0000 mL | Freq: Once | INTRAVENOUS | Status: AC
  Administered 2018-01-07: 500 mL via INTRAVENOUS

## 2018-01-07 MED ORDER — ONDANSETRON HCL 4 MG PO TABS
4.0000 mg | ORAL_TABLET | ORAL | Status: DC | PRN
Start: 2018-01-07 — End: 2018-01-09

## 2018-01-07 MED ORDER — BUTORPHANOL TARTRATE 1 MG/ML IJ SOLN
1.0000 mg | INTRAMUSCULAR | Status: DC | PRN
  Administered 2018-01-07: 1 mg via INTRAVENOUS
  Filled 2018-01-07: qty 1

## 2018-01-07 MED ORDER — OXYCODONE-ACETAMINOPHEN 5-325 MG PO TABS: 1.0000 | ORAL_TABLET | ORAL | Status: DC | PRN

## 2018-01-07 MED ORDER — EPHEDRINE 5 MG/ML INJ
10.0000 mg | INTRAVENOUS | Status: DC | PRN
  Filled 2018-01-07: qty 2

## 2018-01-07 MED ORDER — SENNOSIDES-DOCUSATE SODIUM 8.6-50 MG PO TABS
2.0000 | ORAL_TABLET | ORAL | Status: DC
  Administered 2018-01-07 – 2018-01-08 (×2): 2 via ORAL
  Filled 2018-01-07 (×2): qty 2

## 2018-01-07 MED ORDER — LABETALOL HCL 100 MG PO TABS
100.0000 mg | ORAL_TABLET | Freq: Two times a day (BID) | ORAL | Status: DC
  Administered 2018-01-07 – 2018-01-09 (×4): 100 mg via ORAL
  Filled 2018-01-07 (×4): qty 1

## 2018-01-07 MED ORDER — OXYCODONE-ACETAMINOPHEN 5-325 MG PO TABS: 2.0000 | ORAL_TABLET | ORAL | Status: DC | PRN

## 2018-01-07 MED ORDER — ZOLPIDEM TARTRATE 5 MG PO TABS: 5.0000 mg | ORAL_TABLET | Freq: Every evening | ORAL | Status: DC | PRN

## 2018-01-07 MED ORDER — LIDOCAINE HCL (PF) 1 % IJ SOLN
INTRAMUSCULAR | Status: DC | PRN
  Administered 2018-01-07 (×2): 6 mL via EPIDURAL

## 2018-01-07 MED ORDER — OXYTOCIN 40 UNITS IN LACTATED RINGERS INFUSION - SIMPLE MED
1.0000 m[IU]/min | INTRAVENOUS | Status: DC
  Administered 2018-01-07: 2 m[IU]/min via INTRAVENOUS

## 2018-01-07 MED ORDER — OXYCODONE HCL 5 MG PO TABS: 5.0000 mg | ORAL_TABLET | ORAL | Status: DC | PRN

## 2018-01-07 MED ORDER — SIMETHICONE 80 MG PO CHEW: 80.0000 mg | CHEWABLE_TABLET | ORAL | Status: DC | PRN

## 2018-01-07 MED ORDER — LACTATED RINGERS IV SOLN
INTRAVENOUS | Status: DC
  Administered 2018-01-07 (×2): via INTRAVENOUS

## 2018-01-07 MED ORDER — WITCH HAZEL-GLYCERIN EX PADS: 1.0000 "application " | MEDICATED_PAD | CUTANEOUS | Status: DC | PRN

## 2018-01-07 MED ORDER — PENICILLIN G POT IN DEXTROSE 60000 UNIT/ML IV SOLN
3.0000 10*6.[IU] | INTRAVENOUS | Status: DC
  Administered 2018-01-07 (×2): 3 10*6.[IU] via INTRAVENOUS
  Filled 2018-01-07 (×4): qty 50

## 2018-01-07 MED ORDER — TERBUTALINE SULFATE 1 MG/ML IJ SOLN
0.2500 mg | Freq: Once | INTRAMUSCULAR | Status: DC | PRN
  Filled 2018-01-07: qty 1

## 2018-01-07 MED ORDER — COCONUT OIL OIL
1.0000 "application " | TOPICAL_OIL | Status: DC | PRN
  Administered 2018-01-09: 1 via TOPICAL
  Filled 2018-01-07: qty 120

## 2018-01-07 MED ORDER — SODIUM CHLORIDE 0.9 % IV SOLN
5.0000 10*6.[IU] | Freq: Once | INTRAVENOUS | Status: AC
  Administered 2018-01-07: 5 10*6.[IU] via INTRAVENOUS
  Filled 2018-01-07: qty 5

## 2018-01-07 MED ORDER — DIPHENHYDRAMINE HCL 50 MG/ML IJ SOLN: 12.5000 mg | INTRAMUSCULAR | Status: DC | PRN

## 2018-01-07 MED ORDER — BENZOCAINE-MENTHOL 20-0.5 % EX AERO
1.0000 "application " | INHALATION_SPRAY | CUTANEOUS | Status: DC | PRN
  Administered 2018-01-07: 1 via TOPICAL
  Filled 2018-01-07: qty 56

## 2018-01-07 MED ORDER — FLEET ENEMA 7-19 GM/118ML RE ENEM: 1.0000 | ENEMA | RECTAL | Status: DC | PRN

## 2018-01-07 MED ORDER — OXYTOCIN BOLUS FROM INFUSION
500.0000 mL | Freq: Once | INTRAVENOUS | Status: AC
  Administered 2018-01-07: 500 mL via INTRAVENOUS

## 2018-01-07 MED ORDER — FENTANYL 2.5 MCG/ML BUPIVACAINE 1/10 % EPIDURAL INFUSION (WH - ANES)
14.0000 mL/h | INTRAMUSCULAR | Status: DC | PRN
  Administered 2018-01-07: 14 mL/h via EPIDURAL
  Filled 2018-01-07 (×2): qty 100

## 2018-01-07 MED ORDER — ACETAMINOPHEN 325 MG PO TABS
650.0000 mg | ORAL_TABLET | ORAL | Status: DC | PRN
  Filled 2018-01-07: qty 2

## 2018-01-07 MED ORDER — IBUPROFEN 600 MG PO TABS
600.0000 mg | ORAL_TABLET | Freq: Four times a day (QID) | ORAL | Status: DC
  Administered 2018-01-07 – 2018-01-09 (×8): 600 mg via ORAL
  Filled 2018-01-07 (×8): qty 1

## 2018-01-07 MED ORDER — LACTATED RINGERS IV SOLN: 500.0000 mL | INTRAVENOUS | Status: DC | PRN

## 2018-01-07 MED ORDER — ACETAMINOPHEN 325 MG PO TABS: 650.0000 mg | ORAL_TABLET | ORAL | Status: DC | PRN

## 2018-01-07 MED ORDER — TETANUS-DIPHTH-ACELL PERTUSSIS 5-2.5-18.5 LF-MCG/0.5 IM SUSP: 0.5000 mL | Freq: Once | INTRAMUSCULAR | Status: DC

## 2018-01-07 MED ORDER — PRENATAL MULTIVITAMIN CH
1.0000 | ORAL_TABLET | Freq: Every day | ORAL | Status: DC
  Administered 2018-01-08 – 2018-01-09 (×2): 1 via ORAL
  Filled 2018-01-07 (×2): qty 1

## 2018-01-07 MED ORDER — DIBUCAINE 1 % RE OINT: 1.0000 "application " | TOPICAL_OINTMENT | RECTAL | Status: DC | PRN

## 2018-01-07 MED ORDER — ONDANSETRON HCL 4 MG/2ML IJ SOLN: 4.0000 mg | INTRAMUSCULAR | Status: DC | PRN

## 2018-01-07 MED ORDER — LIDOCAINE HCL (PF) 1 % IJ SOLN
30.0000 mL | INTRAMUSCULAR | Status: AC | PRN
  Administered 2018-01-07: 30 mL via SUBCUTANEOUS
  Filled 2018-01-07: qty 30

## 2018-01-07 MED ORDER — SOD CITRATE-CITRIC ACID 500-334 MG/5ML PO SOLN: 30.0000 mL | ORAL | Status: DC | PRN

## 2018-01-07 MED ORDER — DIPHENHYDRAMINE HCL 25 MG PO CAPS: 25.0000 mg | ORAL_CAPSULE | Freq: Four times a day (QID) | ORAL | Status: DC | PRN

## 2018-01-07 NOTE — Progress Notes (Signed)
No HA, no vision change  Vitals:   01/07/18 1931 01/07/18 1949  BP: (!) 149/102 (!) 148/90  Pulse: 88 90  Resp: 16 16  Temp:    SpO2:      Results for orders placed or performed during the hospital encounter of 01/07/18 (from the past 24 hour(s))  Type and screen [facility]     Status: None   Collection Time: 01/07/18  7:39 AM  Result Value Ref Range   ABO/RH(D) O POS    Antibody Screen NEG    Sample Expiration      01/10/2018 Performed at [facility], [address] [place], [place] [address]   CBC     Status: Abnormal   Collection Time: 01/07/18  7:50 AM  Result Value Ref Range   WBC 12.4 (H) 4.0 - 10.5 K/uL   RBC 4.30 3.87 - 5.11 MIL/uL   Hemoglobin 12.1 12.0 - 15.0 g/dL   HCT 60.435.6 (L) 54.036.0 - 98.146.0 %   MCV 82.8 78.0 - 100.0 fL   MCH 28.1 26.0 - 34.0 pg   MCHC 34.0 30.0 - 36.0 g/dL   RDW 19.114.6 47.811.5 - 29.515.5 %   Platelets 180 150 - 400 K/uL  Comprehensive metabolic panel     Status: Abnormal   Collection Time: 01/07/18  7:50 AM  Result Value Ref Range   Sodium 137 135 - 145 mmol/L   Potassium 3.9 3.5 - 5.1 mmol/L   Chloride 105 101 - 111 mmol/L   CO2 19 (L) 22 - 32 mmol/L   Glucose, Bld 81 65 - 99 mg/dL   BUN 13 6 - 20 mg/dL   Creatinine, Ser 6.210.70 0.44 - 1.00 mg/dL   Calcium 9.3 8.9 - 30.810.3 mg/dL   Total Protein 6.1 (L) 6.5 - 8.1 g/dL   Albumin 2.9 (L) 3.5 - 5.0 g/dL   AST 19 15 - 41 U/L   ALT 11 (L) 14 - 54 U/L   Alkaline Phosphatase 96 38 - 126 U/L   Total Bilirubin 0.4 0.3 - 1.2 mg/dL   GFR calc non Af Amer >60 >60 mL/min   GFR calc Af Amer >60 >60 mL/min   Anion gap 13 5 - 15  ABO/Rh     Status: None   Collection Time: 01/07/18  7:54 AM  Result Value Ref Range   ABO/RH(D)      O POS Performed at [facility], [address] [place], [place] [address]   CBC     Status: Abnormal   Collection Time: 01/07/18  1:05 PM  Result Value Ref Range   WBC 15.3 (H) 4.0 - 10.5 K/uL   RBC 4.33 3.87 - 5.11 MIL/uL   Hemoglobin 12.4 12.0 -  15.0 g/dL   HCT 69.636.2 29.536.0 - 28.446.0 %   MCV 83.6 78.0 - 100.0 fL   MCH 28.6 26.0 - 34.0 pg   MCHC 34.3 30.0 - 36.0 g/dL   RDW 13.214.2 44.011.5 - 10.215.5 %   Platelets 183 150 - 400 K/uL  Comprehensive metabolic panel     Status: Abnormal   Collection Time: 01/07/18  1:05 PM  Result Value Ref Range   Sodium 139 135 - 145 mmol/L   Potassium 4.9 3.5 - 5.1 mmol/L   Chloride 106 101 - 111 mmol/L   CO2 21 (L) 22 - 32 mmol/L   Glucose, Bld 82 65 - 99 mg/dL   BUN 12 6 - 20 mg/dL   Creatinine, Ser 7.250.76 0.44 -  1.00 mg/dL   Calcium 9.5 8.9 - 16.1 mg/dL   Total Protein 7.0 6.5 - 8.1 g/dL   Albumin 3.2 (L) 3.5 - 5.0 g/dL   AST 18 15 - 41 U/L   ALT 12 (L) 14 - 54 U/L   Alkaline Phosphatase 96 38 - 126 U/L   Total Bilirubin 0.2 (L) 0.3 - 1.2 mg/dL   GFR calc non Af Amer >60 >60 mL/min   GFR calc Af Amer >60 >60 mL/min   Anion gap 12 5 - 15  CBC     Status: Abnormal   Collection Time: 01/07/18  6:09 PM  Result Value Ref Range   WBC 23.2 (H) 4.0 - 10.5 K/uL   RBC 4.23 3.87 - 5.11 MIL/uL   Hemoglobin 11.9 (L) 12.0 - 15.0 g/dL   HCT 09.6 (L) 04.5 - 40.9 %   MCV 83.7 78.0 - 100.0 fL   MCH 28.1 26.0 - 34.0 pg   MCHC 33.6 30.0 - 36.0 g/dL   RDW 81.1 91.4 - 78.2 %   Platelets 176 150 - 400 K/uL   A/P: Gestational Hypertension         Will check CMET now         Labetalol 100mg  BID

## 2018-01-07 NOTE — Anesthesia Pain Management Evaluation Note (Signed)
  [facility] Pain Management Visit Note  Patient: Beth English, 27 y.o., female  "Hello I am a [facility] of the anesthesia team at [facility]. We have an anesthesia team available at all times to provide care throughout the hospital, including epidural management and anesthesia for C-section. I don't know your plan for the delivery whether it a natural birth, water birth, IV sedation, nitrous supplementation, doula or epidural, but we want to meet your pain goals."   1.Was your pain managed to your expectations on prior hospitalizations?   Yes   2.What is your expectation for pain management during this hospitalization?     Epidural  3.How can we help you reach that goal? epidural  Record the patient's initial score and the patient's pain goal.   Pain: 0  Pain Goal: 4 [facility] wants you to be able to say your pain was always managed very well.  Nakoa Ganus 01/07/2018

## 2018-01-07 NOTE — Anesthesia Procedure Notes (Signed)
Epidural Patient location during procedure: OB Start time: 01/07/2018 10:58 AM End time: 01/07/2018 11:07 AM  Staffing Anesthesiologist: Leilani AbleHatchett, Lakesha Levinson, MD Performed: anesthesiologist   Preanesthetic Checklist Completed: patient identified, site marked, surgical consent, pre-op evaluation, timeout performed, IV checked, risks and benefits discussed and monitors and equipment checked  Epidural Patient position: sitting Prep: site prepped and draped and DuraPrep Patient monitoring: continuous pulse ox and blood pressure Approach: midline Location: L3-L4 Injection technique: LOR air  Needle:  Needle type: Tuohy  Needle gauge: 17 G Needle length: 9 cm and 9 Needle insertion depth: 7 cm Catheter type: closed end flexible Catheter size: 19 Gauge Catheter at skin depth: 12 cm Test dose: negative and Other  Assessment Sensory level: T10 Events: blood not aspirated, injection not painful, no injection resistance, negative IV test and no paresthesia  Additional Notes Reason for block:procedure for pain

## 2018-01-07 NOTE — Progress Notes (Signed)
Operative Delivery Note At 5:29 PM a viable female was delivered via Vaginal, Vacuum Investment banker, operational(Extractor).  Presentation: vertex; Position: Right,, Occiput,, Anterior; Station: +3.  Verbal consent: obtained from patient.  Risks and benefits discussed in detail.  Risks include, but are not limited to the risks of anesthesia, bleeding, infection, damage to maternal tissues, fetal cephalhematoma.  There is also the risk of inability to effect vaginal delivery of the head, or shoulder dystocia that cannot be resolved by established maneuvers, leading to the need for emergency cesarean section.  Pushing to +3, good movement of vertex with gentle fundal pressure. Patient tired asking for help. D/W VE.   Gentle pull on VE, 1 popoff.  APGAR: 8, 9; weight  .   Placenta status:intact , .   Cord: 3 vessels  with the following complications: none.  Cord pH: pending  Anesthesia: epidural, lidocaine for MLE  Instruments: bell VE Episiotomy second degree MLE Lacerations: Suture Repair: 2.0 vicryl rapide Est. Blood Loss (mL):  300  Mom to postpartum.  Baby to Couplet care / Skin to Skin.  Roselle LocusJames E Aleynah Rocchio II 01/07/2018, 5:48 PM

## 2018-01-07 NOTE — Progress Notes (Signed)
No HA, no vision change, no epigastric pain  Vitals:   01/07/18 0739  BP: (!) 145/93  Pulse: 85  Resp: 18  Temp: 98.6 F (37 C)    Lungs CTA Cor RRR Abdomen no epigastric tenderness Uterus soft, NT DTR 1+ Cx 3/90/-3/vtx/well applied AROM clear  FHT cat one UCs q2-4 min  Labs pending  A/P: Gestational Hypertension         D/W patient above, possible pitocin

## 2018-01-07 NOTE — Anesthesia Preprocedure Evaluation (Signed)
Anesthesia Evaluation  Patient identified by MRN, date of birth, ID band Patient awake    Airway Mallampati: II       Dental no notable dental hx. (+) Teeth Intact   Pulmonary neg pulmonary ROS,    Pulmonary exam normal breath sounds clear to auscultation       Cardiovascular hypertension, Normal cardiovascular exam Rhythm:Regular Rate:Normal     Neuro/Psych    GI/Hepatic negative GI ROS, Neg liver ROS,   Endo/Other  Morbid obesity  Renal/GU negative Renal ROS  negative genitourinary   Musculoskeletal negative musculoskeletal ROS (+)   Abdominal (+) + obese,   Peds  Hematology negative hematology ROS (+)   Anesthesia Other Findings   Reproductive/Obstetrics                             Anesthesia Physical Anesthesia Plan  ASA: III  Anesthesia Plan: Epidural   Post-op Pain Management:    Induction:   PONV Risk Score and Plan:   Airway Management Planned:   Additional Equipment:   Intra-op Plan:   Post-operative Plan:   Informed Consent: I have reviewed the patients History and Physical, chart, labs and discussed the procedure including the risks, benefits and alternatives for the proposed anesthesia with the patient or authorized representative who has indicated his/her understanding and acceptance.     Plan Discussed with:   Anesthesia Plan Comments:         Anesthesia Quick Evaluation

## 2018-01-08 LAB — CBC
HEMATOCRIT: 29.5 % — AB (ref 36.0–46.0)
HEMOGLOBIN: 10.2 g/dL — AB (ref 12.0–15.0)
MCH: 28.6 pg (ref 26.0–34.0)
MCHC: 34.6 g/dL (ref 30.0–36.0)
MCV: 82.6 fL (ref 78.0–100.0)
Platelets: 150 10*3/uL (ref 150–400)
RBC: 3.57 MIL/uL — ABNORMAL LOW (ref 3.87–5.11)
RDW: 14.3 % (ref 11.5–15.5)
WBC: 15.4 10*3/uL — ABNORMAL HIGH (ref 4.0–10.5)

## 2018-01-08 LAB — COMPREHENSIVE METABOLIC PANEL
ALBUMIN: 2.7 g/dL — AB (ref 3.5–5.0)
ALK PHOS: 76 U/L (ref 38–126)
ALT: 10 U/L — ABNORMAL LOW (ref 14–54)
ANION GAP: 10 (ref 5–15)
AST: 14 U/L — ABNORMAL LOW (ref 15–41)
BILIRUBIN TOTAL: 0.4 mg/dL (ref 0.3–1.2)
BUN: 11 mg/dL (ref 6–20)
CALCIUM: 8.5 mg/dL — AB (ref 8.9–10.3)
CO2: 20 mmol/L — ABNORMAL LOW (ref 22–32)
Chloride: 107 mmol/L (ref 101–111)
Creatinine, Ser: 0.7 mg/dL (ref 0.44–1.00)
GFR calc Af Amer: >60 mL/min (ref >60)
GLUCOSE: 77 mg/dL (ref 65–99)
Potassium: 3.8 mmol/L (ref 3.5–5.1)
Sodium: 137 mmol/L (ref 135–145)
TOTAL PROTEIN: 5.7 g/dL — AB (ref 6.5–8.1)

## 2018-01-08 NOTE — Lactation Note (Signed)
This note was copied from a baby's chart. Lactation Consultation Note  Patient Name: Beth English [id number]'QToday's Date: 01/08/2018 Reason for consult: Initial assessment;Primapara;1st time breastfeeding;Term  P1 mother whose infant is now 6212 hours old.  Mother holding baby as I arrived.  He is not showing any feeding cues but offered to assist with latch and mother agreed to try.  Mother's breasts are soft and non tender.  The right breast is smaller than the left.  Nipples are everted but very short bilaterally.  Her right nipple is more everted than the left.  Mother states, "They have always been like that."  Positioned mother appropriately and attempted to latch baby onto the right breast in the football hold.  He was very sleepy and would not open his mouth.  He only sucked a few times on my gloved finger and this was with much stimulation.  Performed suck training to help bring his tongue down.  Burped baby and attempted again on the left breast in the same position.  Baby remains sleepy.  Put him STS with mother and mother will watch for feeding cues.  Reviewed feeding cues.  Mother had a NS in her room from her RN.  I provided breast shells and a manual hand pump with instructions for use.  Provided colostrum container for any EBM that mother may obtain.  Mother had many questions which I answered to her satisfaction.  Mom made aware of O/P services, breastfeeding support groups, community resources, and our phone # for post-discharge questions. Mother will call for assistance as needed.  FOB present and sleeping in chair.   Maternal Data Formula Feeding for Exclusion: No Has patient been taught Hand Expression?: Yes Does the patient have breastfeeding experience prior to this delivery?: No  Feeding Feeding Type: Breast Fed Length of feed: 0 min  LATCH Score Latch: Too sleepy or reluctant, no latch achieved, no sucking elicited.  Audible Swallowing: None  Type of Nipple: Everted  at rest and after stimulation(short shafted bilaterally)  Comfort (Breast/Nipple): Soft / non-tender  Hold (Positioning): Assistance needed to correctly position infant at breast and maintain latch.  LATCH Score: 5  Interventions Interventions: Breast feeding basics reviewed;Assisted with latch;Skin to skin;Breast massage;Hand express;Position options;Support pillows;Adjust position;Breast compression;Shells;Hand pump  Lactation Tools Discussed/Used WIC Program: No Pump Review: Setup, frequency, and cleaning;Milk Storage Initiated by:: Savi Lastinger Date initiated:: 01/09/18   Consult Status Consult Status: Follow-up Date: 01/09/18 Follow-up type: In-patient    Demico Ploch R Akiera Allbaugh 01/08/2018, 6:31 AM

## 2018-01-08 NOTE — Progress Notes (Signed)
Mother of baby was referred for history of depression and anxiety. Referral screened out by CSW because per chart review of prenatal records, patient's diagnosis originated over five years ago and she discontinued medication four years ago. Patient does not have any documented reoccurrences of symptoms within recent years.   Please contact CSW if mother of baby requests, if needs arise, or if mother of baby scores greater than a nine or answers yes to question ten on [place] Postpartum Depression Screen.   Edwin Dadaarol Jaila Schellhorn, MSW, LCSW-A Clinical Social Worker [facility] [phone]

## 2018-01-08 NOTE — Progress Notes (Signed)
Post Partum Day 1 Subjective: no complaints, up ad lib, voiding and tolerating PO  Objective: Blood pressure 127/81, pulse 83, temperature 98.4 F (36.9 C), temperature source Oral, resp. rate 18, height 5\' 10"  (1.778 m), weight 296 lb 9.6 oz (134.5 kg), SpO2 99 %, unknown if currently breastfeeding.  Physical Exam:  General: alert, cooperative and no distress Lochia: appropriate Uterine Fundus: firm Incision: healing well DVT Evaluation: No evidence of DVT seen on physical exam.  Recent Labs    01/07/18 1809 01/08/18 0528  HGB 11.9* 10.2*  HCT 35.4* 29.5*    Assessment/Plan: Plan for discharge tomorrow  Continue Labetalol 100mg  po bid   LOS: 1 day   Roselle LocusJames E Abeer Iversen II 01/08/2018, 9:29 AM

## 2018-01-08 NOTE — Anesthesia Postprocedure Evaluation (Signed)
Anesthesia Post Note  Patient: Beth English  Procedure(s) Performed: AN AD HOC LABOR EPIDURAL     Patient location during evaluation: Mother Baby Anesthesia Type: Epidural Level of consciousness: awake and alert and oriented Pain management: satisfactory to patient Vital Signs Assessment: post-procedure vital signs reviewed and stable Respiratory status: spontaneous breathing and nonlabored ventilation Cardiovascular status: stable Postop Assessment: no headache, no backache, no signs of nausea or vomiting, adequate PO intake, patient able to bend at knees and able to ambulate (patient up walking) Anesthetic complications: no    Last Vitals:  Vitals:   01/07/18 2120 01/08/18 0224  BP: (!) 150/85 127/81  Pulse: 89 83  Resp: 18 18  Temp:    SpO2:      Last Pain:  Vitals:   01/08/18 0224  TempSrc:   PainSc: 0-No pain   Pain Goal:                 Madison HickmanGREGORY,Bakary Bramer

## 2018-01-09 MED ORDER — IBUPROFEN 600 MG PO TABS: 600.0000 mg | ORAL_TABLET | Freq: Four times a day (QID) | ORAL | 0 refills | Status: AC | PRN

## 2018-01-09 MED ORDER — LABETALOL HCL 100 MG PO TABS: 100.0000 mg | ORAL_TABLET | Freq: Two times a day (BID) | ORAL | 1 refills | Status: AC

## 2018-01-09 MED ORDER — ACETAMINOPHEN 325 MG PO TABS: 650.0000 mg | ORAL_TABLET | Freq: Four times a day (QID) | ORAL | 0 refills | Status: AC | PRN

## 2018-01-09 NOTE — Lactation Note (Signed)
This note was copied from a baby's chart. Lactation Consultation Note  Patient Name: Beth English [id number]'UToday's Date: 01/09/2018 Reason for consult: Follow-up assessment;Difficult latch;Early term 37-38.6wks;1st time breastfeeding;Primapara  P1 mother whose infant is now 1937 hours old.  Mother wants to add supplementation to her breastfeeding choice.  Mother has changed her mind from exclusively breastfeeding to breast/bottle feeding.  She informed me that baby will be going to daycare at 6 weeks when she returns to work and she would like to breast/bottle feed due to the family's lifestyle.  She already has formula in the room from her RN.  I offered the foley cup for feeding and mother interested in trying this.  I demonstrated how to hold and feed baby with this method.  He took a total of 14 mls of formula and was very content after feeding.  Mother stated she was very relieved and that she felt like she was "starving" her baby.    Initiated the DEBP so mother can use her own milk for supplementation.  Explained pump set up, pump parts, assembly and cleaning of all parts.  Colostrum container provided for any EBM that mother may obtain while pumping.  Milk storage reviewed.  Encouraged mother to continue STS, breast massage and hand expression.  Reminded her to always breastfeed baby first before pumping.  Mother will call for assistance as needed.  Father present.  Maternal Data Formula Feeding for Exclusion: No Has patient been taught Hand Expression?: Yes Does the patient have breastfeeding experience prior to this delivery?: No  Feeding Feeding Type: Formula Length of feed: 5 min  LATCH Score                   Interventions    Lactation Tools Discussed/Used     Consult Status Consult Status: Follow-up Date: 01/10/18 Follow-up type: In-patient    Dora SimsBeth R Michelina Mexicano 01/09/2018, 6:36 AM

## 2018-01-09 NOTE — Discharge Summary (Signed)
Obstetric Discharge Summary Reason for Admission: induction of labor Prenatal Procedures: none Intrapartum Procedures: vacuum Postpartum Procedures: none Complications-Operative and Postpartum: none Hemoglobin  Date Value Ref Range Status  01/08/2018 10.2 (L) 12.0 - 15.0 g/dL Final   HCT  Date Value Ref Range Status  01/08/2018 29.5 (L) 36.0 - 46.0 % Final    Physical Exam:  General: alert, cooperative and no distress Lochia: appropriate Uterine Fundus: firm Incision: healing well DVT Evaluation: No evidence of DVT seen on physical exam.  Discharge Diagnoses: Term Pregnancy-delivered  Discharge Information: Date: 01/09/2018 Activity: pelvic rest Diet: routine Medications: PNV and Ibuprofen Condition: stable Instructions: refer to practice specific booklet Discharge to: home   Newborn Data: Live born female  Birth Weight: 6 lb 10.2 oz (3010 g) APGAR: 8, 9  Newborn Delivery   Birth date/time:  01/07/2016 17:29:00 Delivery type:  Vaginal, Vacuum (Extractor)     Home with mother.  Beth English 01/09/2018, 6:37 AM

## 2018-01-09 NOTE — Progress Notes (Signed)
Parent request formula to supplement breast feeding due to __mothers choice__. Parents have been informed of small tummy size of newborn, taught hand expression and understands the possible consequences of formula to the health of the infant. The possible consequences shared with patient include 1) Loss of confidence in breastfeeding 2) Engorgement 3) Allergic sensitization of baby(asthma/allergies) and 4) decreased milk supply for mother.After discussion of the above the mother decided to _bottle_ The tool used to give formula supplement will be ___nipple_.

## 2018-01-09 NOTE — Lactation Note (Signed)
This note was copied from a baby's chart. Lactation Consultation Note  Patient Name: Beth Pieter Partridgellison Deakin [id number]'XToday's Date: 01/09/2018 Reason for consult: Follow-up assessment   Baby being put on phototherapy upon entering at approx 40 hours of life. Reviewed hand expression and prepumping before latching. Baby latched initially for a few minutes and then fell asleep. Mother hand expressed good flow from L breast. Helped her w/ DEBP and discussed hands on pumping. Recommend mother post pump 4-6 times per day for 10-20 min with DEBP on initiation setting. Breastfeed before offering formula.  Supplement after following volume guidelines w/ curved tip syringe or if too sleepy. Give baby back volume pumped at the next feeding. Reviewed cleaning and milk storage.   Mom encouraged to feed baby 8-12 times/24 hours and with feeding cues.      Maternal Data Has patient been taught Hand Expression?: Yes  Feeding    LATCH Score                   Interventions Interventions: Breast feeding basics reviewed;Hand express;Pre-pump if needed;Hand pump;DEBP  Lactation Tools Discussed/Used     Consult Status Consult Status: Follow-up Date: 01/10/18 Follow-up type: In-patient    Dahlia ByesBerkelhammer, Ruth [facility] 01/09/2018, 11:11 AM

## 2018-01-09 NOTE — Lactation Note (Signed)
This note was copied from a baby's chart. Lactation Consultation Note  Patient Name: Beth English [id number]'BToday's Date: 01/09/2018 Reason for consult: Follow-up assessment;Difficult latch;Early term 37-38.6wks;1st time breastfeeding;Primapara   P1, Baby 40 hours old. Baby was cluster feeding last night.  Mother stated baby was not getting enough.  Asked if mother understood about cluster feeding. Peds MD in room discussing increased bilirubin. LC left phone number and suggest mother call for LC to view latch.    Maternal Data Formula Feeding for Exclusion: No Has patient been taught Hand Expression?: Yes Does the patient have breastfeeding experience prior to this delivery?: No  Feeding Feeding Type: Formula Length of feed: 5 min  LATCH Score                   Interventions    Lactation Tools Discussed/Used     Consult Status Consult Status: Follow-up Date: 01/10/18 Follow-up type: In-patient    Dahlia ByesBerkelhammer, Roneshia Drew [facility] 01/09/2018, 9:29 AM

## 2018-01-10 LAB — RPR: RPR Ser Ql: NONREACTIVE

## 2020-02-07 ENCOUNTER — Ambulatory Visit
Admission: AD | Admit: 2020-02-07 | Discharge: 2020-02-07 | Disposition: A | Discharge status: Home / Self Care | Payer: PRIVATE HEALTH INSURANCE | Source: Ambulatory Visit

## 2020-02-07 ENCOUNTER — Encounter: Payer: Self-pay | Admitting: Physician Assistant

## 2020-02-07 DIAGNOSIS — J069 Acute upper respiratory infection, unspecified: Secondary | ICD-10-CM

## 2020-02-07 MED ORDER — PREDNISONE 20 MG PO TABS *I*
40.0000 mg | ORAL_TABLET | Freq: Every day | ORAL | 0 refills | Status: AC
Start: 2020-02-07 — End: 2020-02-12

## 2020-02-07 NOTE — Discharge Instructions (Signed)
Follow aftercare instructions.

## 2020-02-07 NOTE — UC Provider Note (Addendum)
History     Chief Complaint   Patient presents with    Cough     Patient presents to UC with cough and nasal congestion that started yesterday. Patient has an albuterol inhaler she uses at home.              Medical/Surgical/Family History     Past Medical History:   Diagnosis Date    ADD (attention deficit disorder)     not formerly diagnosed    Anxiety disorder         Patient Active Problem List   Diagnosis Code    Breast asymmetry in female N70.89            Past Surgical History:   Procedure Laterality Date    TONSILLECTOMY AND ADENOIDECTOMY       Family History   Problem Relation Age of Onset    Cancer Maternal Grandfather         Lung cancer, smoker    Heart failure Maternal Grandmother     Heart failure Maternal Grandfather     Osteoporosis Maternal Aunt     Thyroid disease Maternal Grandfather     Diabetes Maternal Grandmother     Depression Maternal Aunt           Social History     Tobacco Use    Smoking status: Never Smoker    Smokeless tobacco: Never Used   Substance Use Topics    Alcohol use: Yes     Comment: socially     Drug use: No     Living Situation     Questions Responses    Patient lives with     Homeless     Caregiver for other family member     External Services     Employment     Domestic Violence Risk                 Review of Systems   Review of Systems   Constitutional: Negative for chills and fever.   HENT: Positive for congestion.    Respiratory: Positive for cough and shortness of breath.    Cardiovascular: Negative.  Negative for chest pain and leg swelling.   Genitourinary: Negative.    Musculoskeletal: Negative.    Skin: Negative.    All other systems reviewed and are negative.      Physical Exam   Triage Vitals  Triage Start: Start, (02/07/20 1807)   First Recorded BP: 135/80, Resp: (!) 25, Temp: 36.5 C (97.7 F), Temp src: TEMPORAL Oxygen Therapy SpO2: 100 %, Oximetry Source: Rt Hand, O2 Device: None (Room air), Heart Rate: 97, (02/07/20 1809)  .      Physical  Exam  Vitals and nursing note reviewed.   Constitutional:       General: She is not in acute distress.     Appearance: Normal appearance. She is not toxic-appearing.   HENT:      Head: Normocephalic and atraumatic.      Right Ear: Tympanic membrane and ear canal normal.      Left Ear: Tympanic membrane and ear canal normal.      Nose: Congestion present.      Mouth/Throat:      Mouth: Mucous membranes are moist.   Eyes:      Extraocular Movements: Extraocular movements intact.      Conjunctiva/sclera: Conjunctivae normal.      Pupils: Pupils are equal, round, and reactive to light.   Cardiovascular:  Rate and Rhythm: Normal rate and regular rhythm.   Pulmonary:      Effort: Pulmonary effort is normal.      Breath sounds: Normal breath sounds.      Comments: Repeat respirations 20  Musculoskeletal:         General: No swelling or tenderness. Normal range of motion.      Cervical back: Normal range of motion and neck supple.      Right lower leg: No edema.      Left lower leg: No edema.   Skin:     General: Skin is warm and dry.   Neurological:      General: No focal deficit present.      Mental Status: She is alert and oriented to person, place, and time.          Medical Decision Making        Medical Decision Making  Assessment:    Cough    Differential diagnosis:    Samuel [place], covid -vaccinated declined, bronchitis, cap    Plan and Results:    Lungs clear. Continue inhaler prn, prednisone.           Final Diagnosis  Final diagnoses:   [J06.9] Viral URI with cough (Primary)         Demisha Nokes, [place]       I have reviewed nursing documentation, confirmed information with patient, and revised with nursing as necessary.       Mickel Baas, Horace Lukas, [place]  02/07/20 [place], Mattison Golay, [place]  02/07/20 1825

## 2020-02-07 NOTE — ED Triage Notes (Signed)
Patient presents to UC with cough and nasal congestion that started yesterday. Patient has an albuterol inhaler she uses at home.        Triage Note   Amanda Meeker, LPN

## 2020-03-28 DIAGNOSIS — F419 Anxiety disorder, unspecified: Secondary | ICD-10-CM | POA: Insufficient documentation

## 2020-03-28 DIAGNOSIS — G43909 Migraine, unspecified, not intractable, without status migrainosus: Secondary | ICD-10-CM | POA: Insufficient documentation

## 2020-05-15 ENCOUNTER — Other Ambulatory Visit: Payer: Self-pay

## 2020-05-15 ENCOUNTER — Encounter (HOSPITAL_COMMUNITY): Payer: Self-pay | Admitting: Emergency Medicine

## 2020-05-15 ENCOUNTER — Emergency Department (HOSPITAL_BASED_OUTPATIENT_CLINIC_OR_DEPARTMENT_OTHER)
Admit: 2020-05-15 | Discharge: 2020-05-15 | Disposition: A | Discharge status: Home / Self Care | Payer: Commercial Managed Care - PPO | Attending: Emergency Medicine | Admitting: Emergency Medicine

## 2020-05-15 ENCOUNTER — Emergency Department (HOSPITAL_COMMUNITY): Payer: Commercial Managed Care - PPO

## 2020-05-15 ENCOUNTER — Emergency Department (HOSPITAL_COMMUNITY)
Admission: EM | Admit: 2020-05-15 | Discharge: 2020-05-15 | Disposition: A | Discharge status: Home / Self Care | Payer: Commercial Managed Care - PPO | Attending: Emergency Medicine | Admitting: Emergency Medicine

## 2020-05-15 DIAGNOSIS — M25562 Pain in left knee: Secondary | ICD-10-CM

## 2020-05-15 DIAGNOSIS — M7989 Other specified soft tissue disorders: Secondary | ICD-10-CM | POA: Diagnosis not present

## 2020-05-15 DIAGNOSIS — M79609 Pain in unspecified limb: Secondary | ICD-10-CM

## 2020-05-15 DIAGNOSIS — X501XXA Overexertion from prolonged static or awkward postures, initial encounter: Secondary | ICD-10-CM | POA: Diagnosis not present

## 2020-05-15 DIAGNOSIS — M79605 Pain in left leg: Secondary | ICD-10-CM | POA: Diagnosis present

## 2020-05-15 MED ORDER — LIDOCAINE 5 % EX PTCH: 1.0000 | MEDICATED_PATCH | CUTANEOUS | 0 refills | Status: AC

## 2020-05-15 NOTE — ED Provider Notes (Signed)
[facility]-EMERGENCY DEPT Provider Note   CSN: [id number] Arrival date & time: 05/15/20  1656     History Chief Complaint  Patient presents with  . Leg Pain    Beth English is a 29 y.o. female.  HPI     29 year old female presents with concern for left leg pain.  Reports that on Monday night she drove 10 hours in the car and developed pain in her left leg.  Describes the pain is surrounding her knee with extension to behind her knee.  Its worse with ambulation.  Reports pain with both flexion and extension of the knee making it difficult to do so.  Describes the pain is severe, 9 out of 10.  Denies fevers, shortness of breath, history of DVT or PE.  She has an IUD in place and notes that her mother has a history of DVT.   Past Medical History:  Diagnosis Date  . Anxiety   . Depression    meds x 4 years  . Hx of pyelonephritis   . Migraine    migraines  . Pregnancy induced hypertension     Patient Active Problem List   Diagnosis Date Noted  . Gestational hypertension 01/07/2018  . Elevated blood pressure affecting pregnancy in third trimester, antepartum 12/22/2017  . Spotting affecting pregnancy in third trimester 12/22/2017  . Hemiplegic migraine 07/20/2017    Past Surgical History:  Procedure Laterality Date  . TONSILLECTOMY  1998  . TYMPANOSTOMY TUBE PLACEMENT    . WISDOM TOOTH EXTRACTION       OB History    Gravida  1   Para  1   Term  1   Preterm      AB      Living  1     SAB      TAB      Ectopic      Multiple  0   Live Births  1           Family History  Problem Relation Age of Onset  . Hypertension Mother   . Deep vein thrombosis Mother   . COPD Mother   . Rheum arthritis Mother   . Diabetes Other   . Heart disease Maternal Grandmother   . Diabetes Maternal Grandmother   . Heart failure Maternal Grandmother   . Heart disease Maternal Grandfather   . Lung cancer Maternal Grandfather   . Diabetes  Maternal Aunt   . Rheum arthritis Maternal Aunt   . Diabetes Maternal Aunt     Social History   Tobacco Use  . Smoking status: Never Smoker  . Smokeless tobacco: Never Used  Substance Use Topics  . Alcohol use: No    Comment: quit 06/2017 due to pregnancy  . Drug use: No    Home Medications Prior to Admission medications   Medication Sig Start Date End Date Taking? Authorizing Provider  acetaminophen (TYLENOL) 325 MG tablet Take 2 tablets (650 mg total) by mouth every 6 (six) hours as needed (for pain scale < 4). 01/09/18   Harold Hedge, MD  ibuprofen (ADVIL,MOTRIN) 600 MG tablet Take 1 tablet (600 mg total) by mouth every 6 (six) hours as needed. 01/09/18   Harold Hedge, MD  labetalol (NORMODYNE) 100 MG tablet Take 1 tablet (100 mg total) by mouth 2 (two) times daily. 01/09/18   Harold Hedge, MD  lidocaine (LIDODERM) 5 % Place 1 patch onto the skin daily. Remove & Discard patch within 12 hours or  as directed by MD 05/15/20   Alvira Monday, MD  Prenatal Multivit-Min-Fe-FA (PRENATAL VITAMINS PO) Take 1 tablet by mouth daily.    [provider]    Allergies    Bactrim [sulfamethoxazole-trimethoprim] and Zithromax [azithromycin]  Review of Systems   Review of Systems  Constitutional: Negative for fever.  Eyes: Negative for visual disturbance.  Respiratory: Negative for cough and shortness of breath.   Cardiovascular: Negative for chest pain.  Gastrointestinal: Negative for vomiting.  Genitourinary: Negative for difficulty urinating.  Musculoskeletal: Positive for arthralgias and joint swelling. Negative for back pain.  Skin: Negative for rash.  Neurological: Negative for syncope and headaches.    Physical Exam Updated Vital Signs BP 116/86   Pulse 76   Temp 98 F (36.7 C) (Oral)   Resp 15   Ht 5\' 10"  (1.778 m)   Wt 115.7 kg   LMP 05/02/2020   SpO2 99%   BMI 36.59 kg/m   Physical Exam Vitals and nursing note reviewed.  Constitutional:      General:  She is not in acute distress.    Appearance: Normal appearance. She is not ill-appearing, toxic-appearing or diaphoretic.  HENT:     Head: Normocephalic.  Eyes:     Conjunctiva/sclera: Conjunctivae normal.  Cardiovascular:     Rate and Rhythm: Normal rate and regular rhythm.     Pulses: Normal pulses.  Pulmonary:     Effort: Pulmonary effort is normal. No respiratory distress.  Musculoskeletal:        General: Tenderness (left anterior knee) present. No deformity or signs of injury.     Cervical back: No rigidity.     Comments: Pain with active ROM,but able to extend and flex, also notes some pain with passive ROM  Skin:    General: Skin is warm and dry.     Coloration: Skin is not jaundiced or pale.  Neurological:     General: No focal deficit present.     Mental Status: She is alert and oriented to person, place, and time.     ED Results / Procedures / Treatments   Labs (all labs ordered are listed, but only abnormal results are displayed) Labs Reviewed - No data to display  EKG None  Radiology DG Knee Complete 4 Views Left  Result Date: 05/15/2020 CLINICAL DATA:  29 year old female with leg pain, knee pain since yesterday. Recent long drive. EXAM: LEFT KNEE - COMPLETE 4+ VIEW COMPARISON:  None. FINDINGS: Bone mineralization is within normal limits. No evidence of fracture, dislocation, or joint effusion. No evidence of arthropathy or other focal bone abnormality. Soft tissues are unremarkable. IMPRESSION: Negative. Electronically Signed   By: 36 M.D.   On: 05/15/2020 17:42   VAS 05/17/2020 LOWER EXTREMITY VENOUS (DVT) (ONLY [facility] & [facility])  Result Date: 05/16/2020  Lower Venous DVT Study Indications: Pain, and Swelling.  Risk Factors: None identified. Limitations: Poor ultrasound/tissue interface. Comparison Study: No prior studies. Performing Technologist: 05/18/2020 RVT  Examination Guidelines: A complete evaluation includes B-mode imaging, spectral Doppler, color Doppler,  and power Doppler as needed of all accessible portions of each vessel. Bilateral testing is considered an integral part of a complete examination. Limited examinations for reoccurring indications may be performed as noted. The reflux portion of the exam is performed with the patient in reverse Trendelenburg.  +-----+---------------+---------+-----------+----------+--------------+ RIGHTCompressibilityPhasicitySpontaneityPropertiesThrombus Aging +-----+---------------+---------+-----------+----------+--------------+ CFV  Full           Yes      Yes                                 +-----+---------------+---------+-----------+----------+--------------+   +---------+---------------+---------+-----------+----------+--------------+  LEFT     CompressibilityPhasicitySpontaneityPropertiesThrombus Aging +---------+---------------+---------+-----------+----------+--------------+ CFV      Full           Yes      Yes                                 +---------+---------------+---------+-----------+----------+--------------+ SFJ      Full                                                        +---------+---------------+---------+-----------+----------+--------------+ FV Prox  Full                                                        +---------+---------------+---------+-----------+----------+--------------+ FV Mid   Full                                                        +---------+---------------+---------+-----------+----------+--------------+ FV DistalFull                                                        +---------+---------------+---------+-----------+----------+--------------+ PFV      Full                                                        +---------+---------------+---------+-----------+----------+--------------+ POP      Full           Yes      Yes                                  +---------+---------------+---------+-----------+----------+--------------+ PTV      Full                                                        +---------+---------------+---------+-----------+----------+--------------+ PERO     Full                                                        +---------+---------------+---------+-----------+----------+--------------+ Gastroc  Full                                                        +---------+---------------+---------+-----------+----------+--------------+  SSV      Full                                                        +---------+---------------+---------+-----------+----------+--------------+     Summary: RIGHT: - No evidence of common femoral vein obstruction.  LEFT: - There is no evidence of deep vein thrombosis in the lower extremity. However, portions of this examination were limited- see technologist comments above.  - No cystic structure found in the popliteal fossa.  *See table(s) above for measurements and observations. Electronically signed by Lemar Livings MD on 05/16/2020 at 2:12:31 PM.    Final     Procedures Procedures (including critical care time)  Medications Ordered in ED Medications - No data to display  ED Course  I have reviewed the triage vital signs and the nursing notes.  Pertinent labs & imaging results that were available during my care of the patient were reviewed by me and considered in my medical decision making (see chart for details).    MDM Rules/Calculators/A&P                           29 year old female presents with concern for left leg pain.  She has good pulses bilaterally, no sign of acute arterial thrombus.  DVT study was completed and was negative for DVT.  X-ray of the knee showed no evidence of acute abnormalities.  She has no sign of knee effusion, no erythema, no fever, no warmth, and overall has relatively good range of motion and have low suspicion at this time for  septic arthritis.   Suspect likely intraarrangement of the knees such as missed meniscal problem leading to pain.  Differential also includes patellofemoral syndrome.  Recommend continued supportive care, lidocaine patch for pain, ibuprofen and tylenol. Patient discharged in stable condition with understanding of reasons to return.   Final Clinical Impression(s) / ED Diagnoses Final diagnoses:  Acute pain of left knee    Rx / DC Orders ED Discharge Orders         Ordered    lidocaine (LIDODERM) 5 %  Every 24 hours        05/15/20 1841           Alvira Monday, MD 05/17/20 [phone]

## 2020-05-15 NOTE — ED Triage Notes (Signed)
Patient states she has L leg pain, unable to straighten or bend her knee, started this morning, knee was sore yesterday, dove 10 hours in the car Monday night. No hx of blood clots, but her mother has had multiple blood clots in her legs. L posterior knee is swollen and tender to touch. IUD for [facility].

## 2020-05-15 NOTE — Progress Notes (Signed)
Orthopedic Tech Progress Note Patient Details:  Beth English [birth year] [id number]  Ortho Devices Ortho Device/Splint Location: LLE knee immobilizer, crutches Ortho Device/Splint Interventions: Ordered, Application, Adjustment   Post Interventions Patient Tolerated: Well Instructions Provided: Care of device   [name] 05/15/2020, 7:02 PM

## 2020-05-15 NOTE — Progress Notes (Signed)
Left lower extremity venous duplex has been completed. Preliminary results can be found in CV Proc through chart review.  Results were given to Dr. Dalene Seltzer.  05/15/20 6:04 PM Olen Cordial RVT

## 2020-05-15 NOTE — Discharge Instructions (Addendum)
Take Tylenol 1000 mg 4 times a day for 1 week. This is the maximum dose of Tylenol usually take from all sources. Please check other over-the-counter medications and prescriptions to ensure you are not taking other medications that contain acetaminophen.  You may also take ibuprofen 400 mg 6 times a day alternating with or at the same time as tylenol.   

## 2020-10-16 ENCOUNTER — Ambulatory Visit: Payer: PRIVATE HEALTH INSURANCE | Attending: Physician Assistant | Admitting: Physician Assistant

## 2020-10-16 VITALS — BP 133/81 | HR 109 | Temp 97.2°F | Resp 18

## 2020-10-16 DIAGNOSIS — J069 Acute upper respiratory infection, unspecified: Secondary | ICD-10-CM | POA: Insufficient documentation

## 2020-10-16 MED ORDER — PSEUDOEPHEDRINE-GUAIFENESIN 60-600 MG PO TB12 *I*
1.0000 | ORAL_TABLET | Freq: Two times a day (BID) | ORAL | 0 refills | Status: AC | PRN
Start: 2020-10-16 — End: 2020-10-25

## 2020-10-16 MED ORDER — BENZONATATE 200 MG PO CAPS *I*
200.0000 mg | ORAL_CAPSULE | Freq: Three times a day (TID) | ORAL | 0 refills | Status: AC | PRN
Start: 2020-10-16 — End: 2020-10-26

## 2020-10-16 NOTE — Patient Instructions (Signed)
Increase fluids, Warm salt water gargle 3 times a day  Tylenol or motrin for painor fever  Mucinex-D 1 twice a day for conges tion  Tessalon 1 up to 3 times a day as needed for cough

## 2020-10-16 NOTE — UC Provider Note (Signed)
History     Chief Complaint   Patient presents with    Sinus Problem    Cough     Young female with a 5 day HX of cough and congestion. Vitals are stable          Medical/Surgical/Family History     Past Medical History:   Diagnosis Date    ADD (attention deficit disorder)     not formerly diagnosed    Anxiety disorder         Patient Active Problem List   Diagnosis Code    Breast asymmetry in female N64.89            Past Surgical History:   Procedure Laterality Date    TONSILLECTOMY AND ADENOIDECTOMY       Family History   Problem Relation Age of Onset    Cancer Maternal Grandfather         Lung cancer, smoker    Heart failure Maternal Grandmother     Heart failure Maternal Grandfather     Osteoporosis Maternal Aunt     Thyroid disease Maternal Grandfather     Diabetes Maternal Grandmother     Depression Maternal Aunt           Social History     Tobacco Use    Smoking status: Never Smoker    Smokeless tobacco: Never Used   Substance Use Topics    Alcohol use: Yes     Comment: socially     Drug use: No     Living Situation     Questions Responses    Patient lives with     Homeless     Caregiver for other family member     External Services     Employment     Domestic Violence Risk                 Review of Systems   Review of Systems   Constitutional: Positive for activity change and appetite change.   HENT: Positive for congestion and postnasal drip.    Eyes: Negative.    Respiratory: Positive for cough.    Cardiovascular: Negative.    Gastrointestinal: Negative.    Genitourinary: Negative.        Physical Exam   Vitals      First Recorded BP: 133/81, Resp: 18, Temp: 36.2 C (97.2 F), Temp src: TEMPORAL Oxygen Therapy SpO2: 98 %, Heart Rate: 109, (10/16/20 1442)  .      Physical Exam  Vitals reviewed.   Constitutional:       Appearance: Normal appearance.   HENT:      Head: Normocephalic.      Right Ear: Tympanic membrane, ear canal and external ear normal.      Left Ear: Tympanic membrane and  ear canal normal.      Nose: Congestion present.      Mouth/Throat:      Mouth: Mucous membranes are moist.      Comments: PND noted  Eyes:      Extraocular Movements: Extraocular movements intact.      Conjunctiva/sclera: Conjunctivae normal.      Pupils: Pupils are equal, round, and reactive to light.   Cardiovascular:      Rate and Rhythm: Regular rhythm. Tachycardia present.   Pulmonary:      Effort: Pulmonary effort is normal.      Breath sounds: Normal breath sounds.   Musculoskeletal:  General: Normal range of motion.      Cervical back: Normal range of motion.   Skin:     General: Skin is warm and dry.   Neurological:      General: No focal deficit present.      Mental Status: She is alert.          Medical Decision Making   <UCMDM> Samuel Germany, Sinusitis    Increase fluids, Warm salt water gargle 3 times a day  Tylenol or motrin for painor fever  Mucinex-D 1 twice a day for conges tion  Tessalon 1 up to 3 times a day as needed for cough  Final DiagnosisUTI       Elinor Dodge, PA

## 2020-10-16 NOTE — Nursing Note (Signed)
Patient presents to UC with nasal congestion, sinus pressure and pain with dizziness, sore throat ,head and productive cough. Symptoms for 4 days. Patient had a negative at home covid test.

## 2021-03-20 ENCOUNTER — Ambulatory Visit
Admission: RE | Admit: 2021-03-20 | Discharge: 2021-03-20 | Disposition: A | Discharge status: Home / Self Care | Payer: PRIVATE HEALTH INSURANCE | Source: Ambulatory Visit

## 2021-03-20 ENCOUNTER — Encounter: Payer: Self-pay | Admitting: Primary Care

## 2021-03-20 ENCOUNTER — Telehealth: Payer: Self-pay | Admitting: Primary Care

## 2021-03-20 ENCOUNTER — Ambulatory Visit: Payer: PRIVATE HEALTH INSURANCE | Attending: Primary Care | Admitting: Primary Care

## 2021-03-20 ENCOUNTER — Encounter: Payer: Self-pay | Admitting: Gastroenterology

## 2021-03-20 VITALS — BP 118/78 | HR 78 | Temp 97.7°F | Ht 70.0 in | Wt 274.0 lb

## 2021-03-20 DIAGNOSIS — M79672 Pain in left foot: Secondary | ICD-10-CM | POA: Insufficient documentation

## 2021-03-20 DIAGNOSIS — F988 Other specified behavioral and emotional disorders with onset usually occurring in childhood and adolescence: Secondary | ICD-10-CM | POA: Insufficient documentation

## 2021-03-20 DIAGNOSIS — M7732 Calcaneal spur, left foot: Secondary | ICD-10-CM

## 2021-03-20 DIAGNOSIS — Z0000 Encounter for general adult medical examination without abnormal findings: Secondary | ICD-10-CM | POA: Insufficient documentation

## 2021-03-20 DIAGNOSIS — F419 Anxiety disorder, unspecified: Secondary | ICD-10-CM | POA: Insufficient documentation

## 2021-03-20 DIAGNOSIS — G43909 Migraine, unspecified, not intractable, without status migrainosus: Secondary | ICD-10-CM | POA: Insufficient documentation

## 2021-03-20 DIAGNOSIS — Z30431 Encounter for routine checking of intrauterine contraceptive device: Secondary | ICD-10-CM | POA: Insufficient documentation

## 2021-03-20 NOTE — Telephone Encounter (Signed)
LM will try later

## 2021-03-20 NOTE — Progress Notes (Signed)
CC:   Chief Complaint   Patient presents with    New Patient Visit       HPI: Amanda Phelps is a 30 y.o. female who presented today for new patient appointment to establish care with a primary care provider and a physical exam.  She states she has not seen a primary care since she was in college.  They have currently moved back to [place] state due to her mother's declining health.  Overall she states she is feeling healthy.  She would like a referral to GYN to establish due to having an IUD placed approximately 3 years ago.  Does not want to have it removed at this time just to establish with someone in case she is having concerns.  She does note that her anxiety seems to be getting worse recently and feels that she is unable to concentrate on things.  She also has concerns of weight gain and inability to lose weight even though she is exercising for at least 30 minutes 4 days a week.  She played sports through high school and college and feels she has made the right healthy lifestyle changes at home but still is unable to lose weight.  Chronic health issues include migraines, anxiety, ADD.  She is seeing psychiatry at this time via telephone or Zoom visits and states she has really great support at home with her husband and her family.  She also has concerns of pain in her left foot.  She states it started about 6 months ago but seems to just get worse and does not seem to be healing.  She notes the pain in the under arch  of the foot for the most part that rides to the top of her foot.  She notes the pain is increased in the morning when she gets up out of bed or if she relaxes for too long and sits for any period of time and then goes to stand the pain is worse.  She denies any other concerns today.      Review of Systems:  General: Denies fever, chills, lightheadedness, dizziness, weight loss, night sweats  HEENT:  Denies runny nose, epistaxis, sinus pain, ear pain, tinnitus,  sore throat,   Pulmonary:  Denies cough, sputum, wheezing, shortness of breath, dyspnea on exertion, hemoptysis   Cardiovascular: Denies chest pain, PND, orthopnea, palpitations,   Gastrointestinal: Denies nausea, vomiting, abdominal pain,   cramping, diarrhea, constipation,    Genitourinary: Denies urgency, frequency, hesitancy, dysuria, polyuria, incontinence, hematuria  Musculoskeletal: Denies pain, stiffness, joint swelling, or decreased range of motion, left foot pain see HPI  Lymphatic/Hematologic: Denies adenopathy , no excessive bleeding/bruising  Integumentary: Denies rash, itching, lesions  Neurologic: Denies numbness, weakness, balance changes,  seizures, headaches, or paresthesias  Psychiatric: Denies report of depression, + increase in anxiety and inability to concentrate on tasks at home, no suicidal/homicidal ideation      Medications reviewed in eRecord today and confirmed with the patient with No changes being made  Medications:    Current Outpatient Medications:     FLUoxetine (PROZAC) 20 mg capsule, Take 30 mg by mouth daily  , Disp: , Rfl:     levonorgestrel, MIRENA, (MIRENA, 52 MG,) 20 MCG/24HR IUD, Mirena 20 mcg/24 hours (7 yrs) 52 mg intrauterine device  Take by intrauterine route., Disp: , Rfl:     valACYclovir (VALTREX) 1 gm tablet, SMARTSIG:2 Tablet(s) By Mouth Every 12 Hours, Disp: , Rfl:  Allergies:  Allergy History as of 03/20/21     AZITHROMYCIN       Noted Status Severity Type Reaction    07/25/12 1300 Boxx, Zane Herald 07/25/12 Active Low Allergy Rash          SULFAMETHOXAZOLE-TRIMETHOPRIM       Noted Status Severity Type Reaction    07/25/12 1300 Boxx, Zane Herald 07/25/12 Active Low Allergy Rash                Family History:  Family History   Problem Relation Age of Onset    Cancer Maternal Grandfather         Lung cancer, smoker    Heart failure Maternal Grandfather     Thyroid disease Maternal Grandfather     Heart failure Maternal Grandmother     Diabetes Maternal Grandmother     Anemia Maternal  Grandmother     COPD Maternal Grandmother     Osteoporosis Maternal Aunt     Depression Maternal Aunt     Thyroid disease Mother     Diabetes Mother     COPD Mother     Rheum arthritis Mother     Diverticulitis Father        Social History:  Social History     Socioeconomic History    Marital status: Significant Other     Spouse name: Not on file    Number of children: 1    Years of education: Manufacturing engineer    Highest education level: Not on file   Occupational History    Occupation: Conservation officer, nature     Employer: [employer]   Tobacco Use    Smoking status: Never Smoker    Smokeless tobacco: Never Used   Substance and Sexual Activity    Alcohol use: Yes     Comment: socially     Drug use: Yes     Types: Marijuana     Comment: occasional    Sexual activity: Yes     Partners: Male     Birth control/protection: I.U.D.   Social History Narrative    Psychosocial Evaluation:        Lives with roommate and parents    Abuse: No    Depression symptoms:  no    School:  [employer] [place]    Patient is doing well in school:  yes    Grades:  Magazine features editor or learning problems:  no    Peer relationships:  Good friends and boyfriend treats her well.    Family relationships:  Good    Future goals:  Marketing rep                 Vital Signs:  Vitals:    03/20/21 1048   BP: 118/78   Pulse: 78   Temp: 36.5 C (97.7 F)   Weight: 124.3 kg (274 lb)   Height: 1.778 m ('5\' 10"'$ )     Last 4 Weights    03/20/21 1048   Weight: 124.3 kg (274 lb)       Physical Exam:  General: Well developed/well nourished female in no acute distress   ENT: TMs normal bilaterally, moist mucus membranes,Turbinates WNL no erythema or edema  Neck: Supple, no thyromegaly appreciated, no JVD or Bruits  Pulmonary: Lungs clear to auscultation bilaterally with normal air entry, normal to percussion bilaterally, no adventitious breath sounds  Cardiovascular: S1/S2, regular rate/rhythm, no murmur, rubs or gallops appreciated  Gastrointestinal:  Abdomen soft, non-distended  and non-tender, BS+ in all 4 quadrants,  no masses or organomegaly palpated  Musculoskeletal: Range of motion intact with strength 5/5 in all extremities, spine is straight.  Gait is normal  Lymphatic: No anterior cervical or mid clavicular adenopathy  Neurologic: Alert and oriented x3, cranial nerves II through XII are intact, Romberg's negative, reflexes are symmetrical.  Psychiatric: Mood and affect are good.  Appropriate groomed with good eye contact    Recent Lab Work:  No results found for this or any previous visit (from the past 336 hour(s)).       Assessment  1. Routine general medical examination at a health care facility  CBC and differential    Comprehensive metabolic panel    Lipid Panel (Reflex to Direct  LDL if Triglycerides more than 400)   2. Anxiety  TSH    T4, free    Vitamin D    Vitamin B12   3. Migraines  Vitamin D    Vitamin B12   4. Attention deficit disorder, unspecified hyperactivity presence  TSH    T4, free   5. IUD check up  AMB REFERRAL TO OBSTETRICS & GYNECOLOGY   6. Left foot pain  * Foot LEFT standard AP, Lateral, Obliqu          Plan  1.  Routine general medical examination-we will plan labs: CBC, CMP and a lipid profile as well as a vitamin D, B12 and thyroid studies.  We will plan to review labs before we decide to add any other medications on for her anxiety or ADD at this time.  She will continue her Prozac daily and continue at this time to follow-up with psychiatry online or via telephone.  We will plan to refer to [facility] for [facility]YN.  We will plan an x-ray of her left foot today.  If no abnormalities noted possible plantars fasciitis and will try conservative treatment for this with brace at night,  rest, ice, supportive shoes and use of over-the-counter ibuprofen or NSAIDs for decreasing inflammation as well as a referral to podiatry at that time.  She is agreeable to plan and we will plan to follow-up in office in 1 month and as needed    2.  Anxiety-we will plan labs.  No change to medications continue with Prozac and following with psychiatry online or via phone at this time.  Good support at home denies any SI or HI.  Information given to stop at [facility] mental health services if she would like to see someone in person closer to home.  We will plan to follow-up in office to review everything in 1 month and as needed    3.  Migraines-states these have been well controlled since birth of her child which was approximately 3 years ago we will continue to monitor at this time    4.  ADD- no changes to medications today we will plan to evaluate labs before we decide to add any medications.  We did talk about stimulant versus nonstimulant medication she does feel that nonstimulant would be worth a try if we are going to try anything.  We will plan to follow-up in 1 month and as needed    5.  IUD checkup- referred to GYN, Laurance Flatten, CNM    6.  Left foot pain- x-ray today we will plan conservative treatment if no abnormalities with podiatry consult.  Plan is to follow-up in office in 1 month and as needed I will plan to  call her with results of x-ray.    X-ray notes dorsal spur we will plan podiatry eval and treat referral.  Britta Mccreedy, NP  03/20/2021

## 2021-03-20 NOTE — Telephone Encounter (Signed)
Please notify Xray notes Dorsal Bone spur. Podiatry referral made. I do feel there is some plantar facitis also so continue as discussed in office with stretches and brace at HS as well as NSAID's for inflammation. Thank you

## 2021-03-20 NOTE — Telephone Encounter (Signed)
Pt aware and verbalizes understanding  

## 2021-03-31 ENCOUNTER — Ambulatory Visit: Payer: PRIVATE HEALTH INSURANCE | Admitting: Podiatry

## 2021-04-15 ENCOUNTER — Ambulatory Visit: Payer: PRIVATE HEALTH INSURANCE | Admitting: Podiatry

## 2021-04-17 NOTE — Progress Notes (Signed)
Subjective:     Amanda Phelps is a 30 y.o.  y.o. female who presents for an establishing gyn exam. The patient has no complaints today.    GYN History     Cycles are irregular spotting due to IUD.      Last pap smear: Date: unsure - done in [place]   History of abnormal pap smear: No.   The patient is sexually active with fiance of 6 years.     STD History: none  Current contraception: IUD: Mirena placed 2019    Patient's medications, allergies, past medical, surgical, social and family histories were reviewed and updated as appropriate.       Objective:      BP (!) 122/92 (BP Location: Left arm, Patient Position: Sitting)    Ht 1.778 m ('5\' 10"'$ )    Wt 125.6 kg (277 lb)    BMI 39.75 kg/m   General appearance: alert, appears stated age, and no distress  Neck: supple, symmetrical, trachea midline  Back: no tenderness to percussion or palpation  Lungs: clear to auscultation bilaterally  Breasts: normal appearance, no masses or tenderness, Taught monthly breast self examination  Heart: regular rate and rhythm  Abdomen: soft, non-tender; bowel sounds normal; no masses,  no organomegaly  Pelvic: cervix normal in appearance, external genitalia normal, no adnexal masses or tenderness, no cervical motion tenderness, uterus normal size, shape, and consistency, and vagina normal without discharge.  IUD strings visualized.  Pap obtained.  Extremities: extremities normal, atraumatic, no cyanosis or edema  Skin: Skin color, texture, turgor normal. No rashes or lesions      Assessment:      Presents for an establishing gyn exam.    Plan:       Reviewed ASCCP guidelines.  Pap was performed today and pending.  Self breast exam reviewed and encouraged monthly.      Follow up with PCP for primary care concerns.  Follow up annually and as needed for gyn concerns.    [place]

## 2021-04-21 ENCOUNTER — Other Ambulatory Visit
Admission: RE | Admit: 2021-04-21 | Discharge: 2021-04-21 | Disposition: A | Discharge status: Home / Self Care | Payer: PRIVATE HEALTH INSURANCE | Source: Ambulatory Visit | Attending: Primary Care | Admitting: Primary Care

## 2021-04-21 DIAGNOSIS — Z0000 Encounter for general adult medical examination without abnormal findings: Secondary | ICD-10-CM | POA: Insufficient documentation

## 2021-04-21 DIAGNOSIS — G43909 Migraine, unspecified, not intractable, without status migrainosus: Secondary | ICD-10-CM | POA: Insufficient documentation

## 2021-04-21 DIAGNOSIS — F988 Other specified behavioral and emotional disorders with onset usually occurring in childhood and adolescence: Secondary | ICD-10-CM | POA: Insufficient documentation

## 2021-04-21 DIAGNOSIS — F419 Anxiety disorder, unspecified: Secondary | ICD-10-CM | POA: Insufficient documentation

## 2021-04-21 LAB — COMPREHENSIVE METABOLIC PANEL
ALT: 12 U/L (ref 0–35)
AST: 14 U/L (ref 0–35)
Albumin: 4.3 g/dL (ref 3.5–5.2)
Alk Phos: 64 U/L (ref 35–105)
Anion Gap: 16 (ref 7–16)
Bilirubin,Total: 0.4 mg/dL (ref 0.0–1.2)
CO2: 20 mmol/L (ref 20–28)
Calcium: 9 mg/dL (ref 8.8–10.2)
Chloride: 102 mmol/L (ref 96–108)
Creatinine: 0.67 mg/dL (ref 0.51–0.95)
Glucose: 92 mg/dL (ref 60–99)
Lab: 14 mg/dL (ref 6–20)
Potassium: 4.2 mmol/L (ref 3.4–4.7)
Sodium: 138 mmol/L (ref 133–145)
Total Protein: 7.1 g/dL (ref 6.3–7.7)
eGFR BY CREAT: 122 *

## 2021-04-21 LAB — CBC AND DIFFERENTIAL
Baso # K/uL: 0 10*3/uL (ref 0.0–0.1)
Basophil %: 0.4 %
Eos # K/uL: 0.1 10*3/uL (ref 0.0–0.4)
Eosinophil %: 1.9 %
Hematocrit: 38 % (ref 34–45)
Hemoglobin: 12.9 g/dL (ref 11.2–15.7)
IMM Granulocytes #: 0 10*3/uL (ref 0.0–0.0)
IMM Granulocytes: 0.3 %
Lymph # K/uL: 1.4 10*3/uL (ref 1.2–3.7)
Lymphocyte %: 19.4 %
MCH: 30 pg (ref 26–32)
MCHC: 34 g/dL (ref 32–36)
MCV: 88 fL (ref 79–95)
Mono # K/uL: 0.5 10*3/uL (ref 0.2–0.9)
Monocyte %: 6.5 %
Neut # K/uL: 5.3 10*3/uL (ref 1.6–6.1)
Nucl RBC # K/uL: 0 10*3/uL (ref 0.0–0.0)
Nucl RBC %: 0 /100 WBC (ref 0.0–0.2)
Platelets: 237 10*3/uL (ref 160–370)
RBC: 4.4 MIL/uL (ref 3.9–5.2)
RDW: 12.5 % (ref 11.7–14.4)
Seg Neut %: 71.5 %
WBC: 7.4 10*3/uL (ref 4.0–10.0)

## 2021-04-21 LAB — VITAMIN B12: Vitamin B12: 303 pg/mL (ref 232–1245)

## 2021-04-21 LAB — LIPID PANEL
Chol/HDL Ratio: 3.5
Cholesterol: 194 mg/dL
HDL: 55 mg/dL (ref 40–60)
LDL Calculated: 114 mg/dL
Non HDL Cholesterol: 139 mg/dL
Triglycerides: 125 mg/dL

## 2021-04-21 LAB — T4, FREE: Free T4: 1 ng/dL (ref 0.9–1.7)

## 2021-04-21 LAB — TSH: TSH: 5.01 u[IU]/mL — ABNORMAL HIGH (ref 0.27–4.20)

## 2021-04-21 LAB — VITAMIN D: 25-OH Vit Total: 39 ng/mL (ref 30–60)

## 2021-04-23 ENCOUNTER — Ambulatory Visit: Payer: PRIVATE HEALTH INSURANCE

## 2021-04-23 ENCOUNTER — Telehealth: Payer: Self-pay | Admitting: Primary Care

## 2021-04-23 VITALS — BP 122/92 | Ht 70.0 in | Wt 277.0 lb

## 2021-04-23 DIAGNOSIS — Z01419 Encounter for gynecological examination (general) (routine) without abnormal findings: Secondary | ICD-10-CM | POA: Insufficient documentation

## 2021-04-23 NOTE — Telephone Encounter (Signed)
Left patient a detailed voicemail regarding their appointment on:    DATE: 04/24/2021    TIME: 10:00a    PROVIDER: [email or web address]     This needs to be rescheduled because the provider is going to be unavailable on this day.

## 2021-04-23 NOTE — Telephone Encounter (Signed)
Patient called back and states she can wait to speak with Lexine Baton at her follow up appointment

## 2021-04-23 NOTE — Telephone Encounter (Signed)
Patient has concerns about lab results, would like someone to call her please.

## 2021-04-23 NOTE — Telephone Encounter (Signed)
Patient called the office back and rescheduled  to the next available opening and agreeable

## 2021-04-24 ENCOUNTER — Ambulatory Visit: Payer: PRIVATE HEALTH INSURANCE | Admitting: Primary Care

## 2021-04-29 ENCOUNTER — Encounter: Payer: Self-pay | Admitting: Primary Care

## 2021-04-29 ENCOUNTER — Ambulatory Visit: Payer: PRIVATE HEALTH INSURANCE | Attending: Primary Care | Admitting: Primary Care

## 2021-04-29 VITALS — BP 128/88 | HR 78 | Temp 97.2°F | Ht 70.0 in | Wt 278.0 lb

## 2021-04-29 DIAGNOSIS — E039 Hypothyroidism, unspecified: Secondary | ICD-10-CM | POA: Insufficient documentation

## 2021-04-29 DIAGNOSIS — Z23 Encounter for immunization: Secondary | ICD-10-CM | POA: Insufficient documentation

## 2021-04-29 DIAGNOSIS — F419 Anxiety disorder, unspecified: Secondary | ICD-10-CM | POA: Insufficient documentation

## 2021-04-29 DIAGNOSIS — E538 Deficiency of other specified B group vitamins: Secondary | ICD-10-CM | POA: Insufficient documentation

## 2021-04-29 DIAGNOSIS — E559 Vitamin D deficiency, unspecified: Secondary | ICD-10-CM | POA: Insufficient documentation

## 2021-04-29 MED ORDER — CHOLECALCIFEROL 1000 UNIT PO CAPS *WRAPPED*
1000.0000 [IU] | ORAL_CAPSULE | Freq: Every day | ORAL | 5 refills | Status: AC
Start: 2021-04-29 — End: 2021-10-26

## 2021-04-29 MED ORDER — VITAMIN B-12 1000 MCG PO TABS *I*
1000.0000 ug | ORAL_TABLET | Freq: Every day | ORAL | 1 refills | Status: AC
Start: 2021-04-29 — End: 2021-10-26

## 2021-04-29 MED ORDER — LEVOTHYROXINE SODIUM 25 MCG PO TABS *I*
25.0000 ug | ORAL_TABLET | Freq: Every day | ORAL | 5 refills | Status: DC
Start: 2021-04-29 — End: 2021-11-03

## 2021-04-29 NOTE — Progress Notes (Signed)
CC:   Chief Complaint   Patient presents with    Follow-up    Lab Results       HPI: Amanda Phelps is a 30 y.o. female who presented today for follow-up on blood work.  Last visit she had concerns of inability to lose weight and severe fatigue.  She states over the last couple of weeks she is continued with feeling exhausted and feels like all of her joints ache as well as what she feels is increased anxiety.  She states that her anxiety is nothing she can handle and she is using coping skills as well as having good support from her husband and her family members at home.  She notes she just had a discussion with her fianc about how she has been feeling and was hoping I might have some results for her today for her blood work showing there might be something more going on.  She has significant history of hypothyroidism with her mother and grandfather on her mother side.  Other than the above-stated she states she has been feeling healthy denies any sickness or fever recently and would like to have her flu shot today if possible.  Overall she is feeling healthy.  No other concerns today.    Review of Systems:  General: No fever, chills, lightheadedness, dizziness, weight loss, night sweats, feeling exhausted see HPI  HEENT: Denies PND, or sore throat, no difficulties swallowing  Pulmonary: No cough, sputum, wheezing, shortness of breath, dyspnea on exertion, hemoptysis   Cardiovascular: No chest pain, PND, orthopnea, palpitations,   Gastrointestinal: No nausea, vomiting, abdominal pain,   cramping, diarrhea, constipation.  Musculoskeletal: Notes that her muscles and joints ache as if she is run a marathon but notes she has not been able to do much exercising when she is so exhausted at the end of the day.  She notes she used to be very active in sports activities even after she left high school and college.  She notes she has been unable to do this in quite some time due to her exhausted  feeling.      Medications reviewed in eRecord today and confirmed with the patient with Yes changes being made  Medications:    Current Outpatient Medications:     FLUoxetine (PROZAC) 20 mg capsule, Take 30 mg by mouth daily  , Disp: , Rfl:     levonorgestrel, MIRENA, (MIRENA, 52 MG,) 20 MCG/24HR IUD, Mirena 20 mcg/24 hours (7 yrs) 52 mg intrauterine device  Take by intrauterine route., Disp: , Rfl:     levothyroxine (SYNTHROID, LEVOTHROID) 25 mcg tablet, Take 1 tablet (25 mcg total) by mouth daily (before breakfast), Disp: 30 tablet, Rfl: 5    cyanocobalamin (VITAMIN B-12) 1000 MCG tablet, Take 1 tablet (1,000 mcg total) by mouth daily, Disp: 90 tablet, Rfl: 1    cholecalciferol (VITAMIN D) 1,000 unit capsule, Take 1 capsule (1,000 units total) by mouth daily, Disp: 30 capsule, Rfl: 5    valACYclovir (VALTREX) 1 gm tablet, SMARTSIG:2 Tablet(s) By Mouth Every 12 Hours, Disp: , Rfl:     Allergies:  Allergy History as of 04/29/21     AZITHROMYCIN       Noted Status Severity Type Reaction    07/25/12 1300 Boxx, Zane Herald 07/25/12 Active Low Allergy Rash          SULFAMETHOXAZOLE-TRIMETHOPRIM       Noted Status Severity Type Reaction    07/25/12 1300 Boxx, Zane Herald 07/25/12 Active Low  Allergy Rash                Family History:  Family History   Problem Relation Age of Onset    Cancer Maternal Grandfather         Lung cancer, smoker    Heart failure Maternal Grandfather     Thyroid disease Maternal Grandfather     Heart failure Maternal Grandmother     Diabetes Maternal Grandmother     Anemia Maternal Grandmother     COPD Maternal Grandmother     Osteoporosis Maternal Aunt     Depression Maternal Aunt     Thyroid disease Mother     Diabetes Mother     COPD Mother     Rheum arthritis Mother     Diverticulitis Father        Social History:  Social History     Socioeconomic History    Marital status: Significant Other     Spouse name: Not on file    Number of children: 1    Years of education:  Manufacturing engineer    Highest education level: Not on file   Occupational History    Occupation: Conservation officer, nature     Employer: [employer]   Tobacco Use    Smoking status: Never Smoker    Smokeless tobacco: Never Used   Substance and Sexual Activity    Alcohol use: Yes     Comment: socially     Drug use: Yes     Types: Marijuana     Comment: occasional    Sexual activity: Yes     Partners: Male     Birth control/protection: I.U.D.     Comment: IUD palced 2019   Social History Narrative    Psychosocial Evaluation:        Lives with roommate and parents    Abuse: No    Depression symptoms:  no    School:  [employer] [place]    Patient is doing well in school:  yes    Grades:  Magazine features editor or learning problems:  no    Peer relationships:  Good friends and boyfriend treats her well.    Family relationships:  Good    Future goals:  Marketing rep                 Vital Signs:  Vitals:    04/29/21 1033   BP: 128/88   Pulse: 78   Temp: 36.2 C (97.2 F)   Weight: 126.1 kg (278 lb)   Height: 1.778 m (_0 )     Last 4 Weights    04/29/21 1033   Weight: 126.1 kg (278 lb)       Physical Exam:  General: Well developed/well nourished female in no acute distress   Pulmonary: Lungs clear to auscultation bilaterally with normal air entry, normal to percussion bilaterally, no adventitious breath sounds  Cardiovascular: S1/S2, regular rate/rhythm, no murmur appreciated  Gastrointestinal: Abdomen soft, non-distended and non-tender in all 4 quadrants, b, no masses or organomegaly palpated  Psychiatric: Mood and affect are good.  Appropriate groomed with good eye contact    Recent Lab Work:  Recent Results (from the past 336 hour(s))   CBC and differential    Collection Time: 04/21/21  8:53 AM   Result Value Ref Range    WBC 7.4 4.0 - 10.0 THOU/uL    RBC 4.4 3.9 - 5.2 MIL/uL    Hemoglobin 12.9 11.2 - 15.7 g/dL  Hematocrit 38 34 - 45 %    MCV 88 79 - 95 fL    MCH 30 26 - 32 pg    MCHC 34 32 - 36 g/dL    RDW 12.5 11.7 -  14.4 %    Platelets 237 160 - 370 THOU/uL    Seg Neut % 71.5 %    Lymphocyte % 19.4 %    Monocyte % 6.5 %    Eosinophil % 1.9 %    Basophil % 0.4 %    Neut # K/uL 5.3 1.6 - 6.1 THOU/uL    Lymph # K/uL 1.4 1.2 - 3.7 THOU/uL    Mono # K/uL 0.5 0.2 - 0.9 THOU/uL    Eos # K/uL 0.1 0.0 - 0.4 THOU/uL    Baso # K/uL 0.0 0.0 - 0.1 THOU/uL    Nucl RBC % 0.0 0.0 - 0.2 /100 WBC    Nucl RBC # K/uL 0.0 0.0 - 0.0 THOU/uL    IMM Granulocytes # 0.0 0.0 - 0.0 THOU/uL    IMM Granulocytes 0.3 %   Comprehensive metabolic panel    Collection Time: 04/21/21  8:53 AM   Result Value Ref Range    Sodium 138 133 - 145 mmol/L    Potassium 4.2 3.4 - 4.7 mmol/L    Chloride 102 96 - 108 mmol/L    CO2 20 20 - 28 mmol/L    Anion Gap 16 7 - 16    UN 14 6 - 20 mg/dL    Creatinine 0.67 0.51 - 0.95 mg/dL    eGFR BY CREAT 122 *    Glucose 92 60 - 99 mg/dL    Calcium 9.0 8.8 - 10.2 mg/dL    Total Protein 7.1 6.3 - 7.7 g/dL    Albumin 4.3 3.5 - 5.2 g/dL    Bilirubin,Total 0.4 0.0 - 1.2 mg/dL    AST 14 0 - 35 U/L    ALT 12 0 - 35 U/L    Alk Phos 64 35 - 105 U/L   Lipid Panel (Reflex to Direct  LDL if Triglycerides more than 400)    Collection Time: 04/21/21  8:53 AM   Result Value Ref Range    Cholesterol 194 mg/dL    Triglycerides 125 mg/dL    HDL 55 40 - 60 mg/dL    LDL Calculated 114 mg/dL    Non HDL Cholesterol 139 mg/dL    Chol/HDL Ratio 3.5    TSH    Collection Time: 04/21/21  8:53 AM   Result Value Ref Range    TSH 5.01 (H) 0.27 - 4.20 uIU/mL   T4, free    Collection Time: 04/21/21  8:53 AM   Result Value Ref Range    Free T4 1.0 0.9 - 1.7 ng/dL   Vitamin D    Collection Time: 04/21/21  8:53 AM   Result Value Ref Range    25-OH Vit Total 39 30 - 60 ng/mL   Vitamin B12    Collection Time: 04/21/21  8:53 AM   Result Value Ref Range    Vitamin B12 303 232 - 1,245 pg/mL          Assessment  1. B12 deficiency  cyanocobalamin (VITAMIN B-12) 1000 MCG tablet   2. Vitamin D deficiency  cholecalciferol (VITAMIN D) 1,000 unit capsule   3. Hypothyroidism,  unspecified type  levothyroxine (SYNTHROID, LEVOTHROID) 25 mcg tablet    T4, free    TSH    THYROID PEROXIDASE  ANTIBODY   4. Need for immunization against influenza  Flu vaccine quadrivalent greater than or equal to 53mopreservative free IM   5. Anxiety disorder, unspecified type          Plan  1.  B12 deficiency- B12 at bare minimum of normal value would like to see this closer to the 500+ marker.  We will plan to start vitamin B12 thousand MCG's 1 tablet p.o. daily.  She is aware that this may not be covered by her insurance and she may have to pick up over-the-counter.  Prescription sent to pharmacy.  We will plan to follow-up in 3 months via phone and in office visit in 6 months and as needed.    2.  Vitamin D deficiency-again level bare minimum at 39.  We will plan to supplement with 2000 IUs of vitamin D3 p.o. gelcap daily.  Follow-up in 3 months via phone and in office visit in 6 months and as needed.    3.  Hypothyroidism, unspecified- T4 within normal limits, TSH 5.01 elevated slightly, we will plan to start levothyroxine 25 mcg tablet 1 tablet p.o. daily.  Encouraged not to drink or eat anything about 30 minutes after taking medication and to take medication first thing in the morning on an empty stomach.  She was also educated on not taking any multivitamins or any antacids with this medication and to give at least 3 to 4 hours in between taking her levothyroxine and the need for any antacids or multivitamins.  She states an understanding.  We did discuss lifestyle changes such as dietary changes and increase in exercise.  I have encouraged increased vegetables lean proteins and whole grains in her diet.  I have encouraged at least 30 minutes of exercise moderate intensity 5 days a week.  I recommended she start slow and work her way up in order to be able to maintain and keep a lifestyle program going if she moves forward.  She states an understanding and is agreeable to plan of care.  We will plan labs  in 12 weeks with a telephone follow-up when they result in an in office visit in 6 months and as needed.  Encouraged once starting medication if she has any racing heart rate or side effects to please call office as we may need to check lab levels prior to the 12-week marker.    4.  Need for immunization against influenza-risk versus benefits reviewed verbal consent given for influenza vaccine today VIS given.    5.  Anxiety disorder, unspecified- we will plan to continue Prozac 20 mg 1 tablet p.o. daily at this time.  She denies need for any mental health counseling states that she has good support system at home with her husband and her family members.  We will plan office visit in 6 months and as needed

## 2021-05-02 LAB — GYN CYTOLOGY

## 2021-05-05 ENCOUNTER — Telehealth: Payer: Self-pay | Admitting: Primary Care

## 2021-05-05 ENCOUNTER — Other Ambulatory Visit: Payer: Self-pay | Admitting: Primary Care

## 2021-05-05 ENCOUNTER — Encounter: Payer: Self-pay | Admitting: Primary Care

## 2021-05-05 DIAGNOSIS — F419 Anxiety disorder, unspecified: Secondary | ICD-10-CM

## 2021-05-05 MED ORDER — FLUOXETINE HCL 20 MG PO CAPS *I*
20.0000 mg | ORAL_CAPSULE | Freq: Every day | ORAL | 2 refills | Status: DC
Start: 2021-05-05 — End: 2021-06-02

## 2021-05-05 MED ORDER — FLUOXETINE HCL 40 MG PO CAPS *I*
40.0000 mg | ORAL_CAPSULE | Freq: Every day | ORAL | 0 refills | Status: DC
Start: 2021-05-05 — End: 2021-05-05

## 2021-05-05 NOTE — Telephone Encounter (Signed)
FLUoxetine (PROZAC)     Patient is calling requesting a refill on this medication     She states she has never had this filled through [facility] but her psychiatrist is no longer in network with her new health insurance.

## 2021-05-05 NOTE — Telephone Encounter (Signed)
Medication dose correction

## 2021-05-05 NOTE — Telephone Encounter (Signed)
Medication has been pended if you wish to fill this.

## 2021-05-06 ENCOUNTER — Encounter: Payer: Self-pay | Admitting: Primary Care

## 2021-05-06 ENCOUNTER — Ambulatory Visit: Payer: BLUE CROSS/BLUE SHIELD | Attending: Primary Care | Admitting: Primary Care

## 2021-05-06 VITALS — BP 120/88 | HR 78 | Temp 97.6°F | Ht 70.0 in | Wt 281.0 lb

## 2021-05-06 DIAGNOSIS — Z20822 Contact with and (suspected) exposure to covid-19: Secondary | ICD-10-CM | POA: Insufficient documentation

## 2021-05-06 DIAGNOSIS — J069 Acute upper respiratory infection, unspecified: Secondary | ICD-10-CM

## 2021-05-06 DIAGNOSIS — Z20828 Contact with and (suspected) exposure to other viral communicable diseases: Secondary | ICD-10-CM | POA: Insufficient documentation

## 2021-05-06 MED ORDER — BENZONATATE 100 MG PO CAPS *I*
100.0000 mg | ORAL_CAPSULE | Freq: Three times a day (TID) | ORAL | 0 refills | Status: AC | PRN
Start: 2021-05-06 — End: 2021-05-11

## 2021-05-06 MED ORDER — ALBUTEROL SULFATE HFA 108 (90 BASE) MCG/ACT IN AERS *I*
1.0000 | INHALATION_SPRAY | Freq: Four times a day (QID) | RESPIRATORY_TRACT | 0 refills | Status: DC | PRN
Start: 2021-05-06 — End: 2021-05-28

## 2021-05-06 NOTE — Progress Notes (Signed)
CC:   Chief Complaint   Patient presents with    Cough     lung congestion, burning       HPI: Amanda Phelps is a 30 y.o. female who presented for cough and congestion.  Reports her 57-year-old son has been sick with cold-like symptoms last week.  COVID came back negative on her son.  Patient developed cold-like symptoms on Sunday with headache, runny nose, and fever.  On Monday her symptoms progressed to frequent nonproductive cough, lung congestion with tightness and burning sensation, increased sore throat, headache, chills and body aches.  Denies any diarrhea, loss of taste or smell.  She is fully vaccinated against COVID.  Denies any chest pain, leg pain or swelling.    Review of Systems:   See HPI as above    Allergy History as of 05/06/21     AZITHROMYCIN       Noted Status Severity Type Reaction    07/25/12 1300 Boxx, Zane Herald 07/25/12 Active Low Allergy Rash          SULFAMETHOXAZOLE-TRIMETHOPRIM       Noted Status Severity Type Reaction    07/25/12 1300 Boxx, Zane Herald 07/25/12 Active Low Allergy Rash                Medications reviewed in eRecord today and confirmed with the patient with Yes changes being made    Current Outpatient Medications on File Prior to Visit   Medication Sig Dispense Refill    FLUoxetine (PROZAC) 20 mg capsule Take 1 capsule (20 mg total) by mouth daily 30 capsule 2    levothyroxine (SYNTHROID, LEVOTHROID) 25 mcg tablet Take 1 tablet (25 mcg total) by mouth daily (before breakfast) 30 tablet 5    cyanocobalamin (VITAMIN B-12) 1000 MCG tablet Take 1 tablet (1,000 mcg total) by mouth daily 90 tablet 1    cholecalciferol (VITAMIN D) 1,000 unit capsule Take 1 capsule (1,000 units total) by mouth daily 30 capsule 5    levonorgestrel, MIRENA, (MIRENA, 52 MG,) 20 MCG/24HR IUD Mirena 20 mcg/24 hours (7 yrs) 52 mg intrauterine device   Take by intrauterine route.      valACYclovir (VALTREX) 1 gm tablet SMARTSIG:2 Tablet(s) By Mouth Every 12 Hours       No current  facility-administered medications on file prior to visit.       History obtained from the patient.    Past Medical History:   Diagnosis Date    ADD (attention deficit disorder)     not formerly diagnosed    Anxiety disorder        Social History     Socioeconomic History    Marital status: Significant Other     Spouse name: Not on file    Number of children: 1    Years of education: [place]    Highest education level: Not on file   Occupational History    Occupation: Conservation officer, nature     Employer: [employer]   Tobacco Use    Smoking status: Never Smoker    Smokeless tobacco: Never Used   Substance and Sexual Activity    Alcohol use: Yes     Comment: socially     Drug use: Yes     Types: Marijuana     Comment: occasional    Sexual activity: Yes     Partners: Male     Birth control/protection: I.U.D.     Comment: IUD palced 2019   Social History Narrative  Psychosocial Evaluation:        Lives with roommate and parents    Abuse: No    Depression symptoms:  no    School:  [employer] [place]    Patient is doing well in school:  yes    Grades:  [employer]    Behavioral or learning problems:  no    Peer relationships:  Good friends and boyfriend treats her well.    Family relationships:  Good    Future goals:  Marketing rep                 Past Surgical History:   Procedure Laterality Date    TONSILLECTOMY AND ADENOIDECTOMY         Family History   Problem Relation Age of Onset    Cancer Maternal Grandfather         Lung cancer, smoker    Heart failure Maternal Grandfather     Thyroid disease Maternal Grandfather     Heart failure Maternal Grandmother     Diabetes Maternal Grandmother     Anemia Maternal Grandmother     COPD Maternal Grandmother     Osteoporosis Maternal Aunt     Depression Maternal Aunt     Thyroid disease Mother     Diabetes Mother     COPD Mother     Rheum arthritis Mother     Diverticulitis Father        Vitals:    05/06/21 1308   BP: 120/88   Pulse: 78   Temp: 36.4  C (97.6 F)   Weight: 127.5 kg (281 lb)   Height: 1.778 m (5\' 10" )     Body mass index is 40.32 kg/m.    Physical Examination:  General appearance -alert, well-developed, well-groomed, appears ill but in no acute distress  Mental status - alert, oriented to person, place, and time, normal mood, behavior, speech, dress, motor activity, and thought processes  Eyes -sclera clear, conjunctiva without discharge  Ears -bilateral ear canals normal, right TM serous effusion without redness, left TM normal  Nose - mucosal congestion, mucosal erythema and purulent rhinorrhea  Mouth -mucous membranes moist, pharynx pink without exudate  Neck - bilateral symmetric anterior adenopathy  Chest - no tachypnea, retractions or cyanosis, room air oximetry = 98%, bronchospasm noted with deep inspiration, lung sounds with occasional expiratory wheeze, no rales, or rhonchi  Heart - normal rate, regular rhythm, normal S1, S2, no murmurs, rubs, clicks or gallops  Extremities - no edema, redness or tenderness in the calves or thighs       Recent Labwork/Imaging:  Recent Results (from the past 336 hour(s))   GYN Cytology    Collection Time: 04/23/21 11:22 AM   Result Value Ref Range    GYN Cytology       [id number]    Clinical Data  Source Name: CERVICAL PAP SMEAR, LIQUID BASED SCRN  # of Monolayers: 1  # of Slides: 1      Diagnostic or Screen: Screening     SPECIMEN ADEQUACY:  Satisfactory for Evaluation    GENERAL CATEGORIZATION:  Negative for intraepithelial lesion or malignancy.    COMMENT:  This specimen has been analyzed with the aid of the ThinPrep  Imaging System.  Every slide is reviewed by a cytotechnologist and any  detected potential cellular abnormality is interpreted by a  pathologist.      Note: Pap tests are processed, reviewed, and reported at [facility], [address] [place], [place],  [place] [address].    Cytotechnologist:                      Electronic Signature:  Darden Amber, M.S., CT (ASCP)  Darden Amber, M.S., CT                                         (ASCP)                                         Reported Date:  05/02/2021              Assessment:  1. Viral URI with cough  COVID-19 PCR    Influenza A PCR    Influenza B PCR    RSV PCR         Plan:  1.) Viral URI with cough: Discussed that her illness appears to be viral.  We will check for COVID, influenza, and RSV.  Recommend albuterol inhaler 1 to 2 puffs every 6 hours as needed for bronchospasm and chest tightness.  We will also order Tessalon 100 mg 3 times a day as needed for cough.  Encouraged to increase fluids, may take Tylenol or ibuprofen for body aches and sore throat.  Also encouraged to use salt water gargles or Listerine gargles for sore throat.  Encouraged to start vitamin C and vitamin D supplements.  Instructed to call if symptoms persist or worsen.  Patient in agreement with this plan.      Orders Placed This Encounter   Procedures    Influenza A PCR     Standing Status:   Future     Standing Expiration Date:   08/06/2021    Influenza B PCR     Standing Status:   Future     Standing Expiration Date:   08/06/2021    RSV PCR     Standing Status:   Future     Standing Expiration Date:   08/06/2021    COVID-19 PCR     Standing Status:   Future     Standing Expiration Date:   08/06/2021     Order Specific Question:   Source?     Answer:   Nasopharyngeal     Order Specific Question:   Please select the patients occupation.     Answer:   Other essential employee     Order Specific Question:   Does this patient currently have symptoms concerning for COVID?     Answer:   Yes     Order Specific Question:   Please select the appropriate setting     Answer:   Ambulatory         Bebe Liter, NP 05/06/2021

## 2021-05-06 NOTE — Addendum Note (Signed)
Addended byAlric Ran on: 05/06/2021 02:02 PM     Modules accepted: Orders

## 2021-05-07 ENCOUNTER — Encounter: Payer: Self-pay | Admitting: Primary Care

## 2021-05-07 LAB — INFLUENZA A: Influenza A PCR: 0

## 2021-05-07 LAB — RSV PCR: RSV PCR: 0

## 2021-05-07 LAB — COVID-19 NAAT (PCR): COVID-19 NAAT (PCR): NEGATIVE

## 2021-05-07 LAB — INFLUENZA B PCR: Influenza B PCR: 0

## 2021-05-07 NOTE — Telephone Encounter (Signed)
MyChart message sent to patient to inform of negative covid, flu, and RSV results.

## 2021-05-09 ENCOUNTER — Telehealth: Payer: Self-pay | Admitting: Primary Care

## 2021-05-09 MED ORDER — AMOXICILLIN-POT CLAVULANATE 875-125 MG PO TABS *I*
1.0000 | ORAL_TABLET | Freq: Two times a day (BID) | ORAL | 0 refills | Status: AC
Start: 2021-05-09 — End: 2021-05-16

## 2021-05-09 MED ORDER — FLUTICASONE PROPIONATE 50 MCG/ACT NA SUSP *I*
1.0000 | Freq: Every day | NASAL | 5 refills | Status: DC
Start: 2021-05-09 — End: 2021-06-04

## 2021-05-09 MED ORDER — PREDNISONE 20 MG PO TABS *I*
40.0000 mg | ORAL_TABLET | Freq: Every day | ORAL | 0 refills | Status: AC
Start: 2021-05-09 — End: 2021-05-14

## 2021-05-09 NOTE — Telephone Encounter (Signed)
Patient was seen on 05/06/21 with cough and congestion. Symptoms worsening with increased chest congestion, wheezing, fever, chills, sinus congestion and cough. Flu, RSV, and Covid negative. Will order flonase, prednisone, and augmentin. Patient in agreement.

## 2021-05-28 ENCOUNTER — Other Ambulatory Visit: Payer: Self-pay | Admitting: Primary Care

## 2021-06-02 ENCOUNTER — Other Ambulatory Visit: Payer: Self-pay | Admitting: Primary Care

## 2021-06-02 DIAGNOSIS — F419 Anxiety disorder, unspecified: Secondary | ICD-10-CM

## 2021-06-02 MED ORDER — FLUOXETINE HCL 20 MG PO CAPS *I*
20.0000 mg | ORAL_CAPSULE | Freq: Every day | ORAL | 2 refills | Status: AC
Start: 2021-06-02 — End: ?

## 2021-06-02 NOTE — Progress Notes (Signed)
Medication request from pharmacy was 40 mg dose though this was changed last month to reflect dose of 20 mg as correct dosage. Medication Prozac sent at 20 mg dose to phrmacy.

## 2021-06-04 ENCOUNTER — Ambulatory Visit: Payer: BLUE CROSS/BLUE SHIELD | Attending: Primary Care | Admitting: Primary Care

## 2021-06-04 ENCOUNTER — Other Ambulatory Visit: Payer: Self-pay

## 2021-06-04 ENCOUNTER — Encounter: Payer: Self-pay | Admitting: Primary Care

## 2021-06-04 VITALS — BP 120/80 | HR 93 | Temp 97.7°F | Wt 275.0 lb

## 2021-06-04 DIAGNOSIS — J019 Acute sinusitis, unspecified: Secondary | ICD-10-CM | POA: Insufficient documentation

## 2021-06-04 DIAGNOSIS — J029 Acute pharyngitis, unspecified: Secondary | ICD-10-CM | POA: Insufficient documentation

## 2021-06-04 DIAGNOSIS — B9689 Other specified bacterial agents as the cause of diseases classified elsewhere: Secondary | ICD-10-CM | POA: Insufficient documentation

## 2021-06-04 LAB — POCT AMBULATORY RAPID STREP
Lot #: 221262
Rapid Strep Group A Throat-POC: NEGATIVE

## 2021-06-04 MED ORDER — AMOXICILLIN-POT CLAVULANATE 875-125 MG PO TABS *I*
1.0000 | ORAL_TABLET | Freq: Two times a day (BID) | ORAL | 0 refills | Status: AC
Start: 2021-06-04 — End: 2021-06-14

## 2021-06-04 NOTE — Addendum Note (Signed)
Addended byDia Crawford on: 06/04/2021 11:13 AM     Modules accepted: Orders

## 2021-06-04 NOTE — Progress Notes (Signed)
CC:   Chief Complaint   Patient presents with    Pharyngitis       HPI: Amanda Phelps is a 30 y.o. female who presented today for concerns of pharyngitis with a sinus pressure and increasing postnasal drip with coughing.  Negative COVID test at home, and states that she was seen in the beginning of October same symptoms having issues started clearing for a little while but then came back after about a week and now she feels worse.  She states that she has been using her Flonase at home as well as using a humidifier in the home.  She notes that she has been doing salt water gargles occasionally for the sore throat and using throat drops.  She states she is more having issues with sinus pressure and drainage.  She denies a fever at this time but states that secretions in her sinuses have started to increase recently.  She notes that they are yellow-greenish in the morning and then clear as the day goes on.  She states that she thinks that her sore throat is coming from the excessive secretions from her sinuses.  Other than the above-stated she denies any nausea or vomiting body aches or fever.  No other concerns today    Review of Systems:  No other concerns and stated in HPI please see HPI      Medications reviewed in eRecord today and confirmed with the patient with Yes changes being made  Medications:    Current Outpatient Medications:     FLUoxetine (PROZAC) 20 mg capsule, Take 1 capsule (20 mg total) by mouth daily, Disp: 30 capsule, Rfl: 2    levothyroxine (SYNTHROID, LEVOTHROID) 25 mcg tablet, Take 1 tablet (25 mcg total) by mouth daily (before breakfast), Disp: 30 tablet, Rfl: 5    cyanocobalamin (VITAMIN B-12) 1000 MCG tablet, Take 1 tablet (1,000 mcg total) by mouth daily, Disp: 90 tablet, Rfl: 1    cholecalciferol (VITAMIN D) 1,000 unit capsule, Take 1 capsule (1,000 units total) by mouth daily, Disp: 30 capsule, Rfl: 5    levonorgestrel, MIRENA, (MIRENA, 52 MG,) 20 MCG/24HR IUD, Mirena 20 mcg/24  hours (7 yrs) 52 mg intrauterine device  Take by intrauterine route., Disp: , Rfl:     valACYclovir (VALTREX) 1 gm tablet, SMARTSIG:2 Tablet(s) By Mouth Every 12 Hours, Disp: , Rfl:     amoxicillin-clavulanate (AUGMENTIN) 875-125 mg tablet, Take 1 tablet by mouth 2 times daily for 10 days  for Sinus Irritation and Congestion, Disp: 20 tablet, Rfl: 0    albuterol HFA (PROVENTIL, VENTOLIN, PROAIR HFA) 108 (90 Base) MCG/ACT inhaler, INHALE 1-2 PUFFS EVERY 6 HOURS AS NEEDED FOR WHEEZING FOR SPASM OF LUNG AIR PASSAGES SHAKE WELL BEFORE EACH USE, Disp: 8.5 g, Rfl: 3    Allergies:  Allergy History as of 06/04/21     AZITHROMYCIN       Noted Status Severity Type Reaction    07/25/12 1300 Boxx, Zane Herald 07/25/12 Active Low Allergy Rash          SULFAMETHOXAZOLE-TRIMETHOPRIM       Noted Status Severity Type Reaction    07/25/12 1300 Boxx, Zane Herald 07/25/12 Active Low Allergy Rash                Family History:  Family History   Problem Relation Age of Onset    Cancer Maternal Grandfather         Lung cancer, smoker    Heart failure Maternal Grandfather  Thyroid disease Maternal Grandfather     Heart failure Maternal Grandmother     Diabetes Maternal Grandmother     Anemia Maternal Grandmother     COPD Maternal Grandmother     Osteoporosis Maternal Aunt     Depression Maternal Aunt     Thyroid disease Mother     Diabetes Mother     COPD Mother     Rheum arthritis Mother     Diverticulitis Father        Social History:  Social History     Socioeconomic History    Marital status: Significant Other    Number of children: 1    Years of education: Manufacturing engineer   Occupational History    Occupation: Control and instrumentation engineer: [employer]   Tobacco Use    Smoking status: Never    Smokeless tobacco: Never   Substance and Sexual Activity    Alcohol use: Yes     Comment: socially     Drug use: Yes     Types: Marijuana     Comment: occasional    Sexual activity: Yes     Partners: Male     Birth  control/protection: I.U.D.     Comment: IUD palced 2019   Social History Narrative    Psychosocial Evaluation:        Lives with roommate and parents    Abuse: No    Depression symptoms:  no    School:  [employer]    Patient is doing well in school:  yes    Grades:  Magazine features editor or learning problems:  no    Peer relationships:  Good friends and boyfriend treats her well.    Family relationships:  Good    Future goals:  Marketing rep                 Vital Signs:  Vitals:    06/04/21 1010   BP: 120/80   Pulse: 93   Temp: 36.5 C (97.7 F)   Weight: 124.7 kg (275 lb)     Last 4 Weights    06/04/21 1010   Weight: 124.7 kg (275 lb)       Physical Exam:  General: Well developed/well nourished female in no acute distress   Pulmonary: Lungs clear to auscultation bilaterally with normal air entry, normal to percussion bilaterally, no adventitious breath sounds  Cardiovascular: S1/S2, regular rate/rhythm, no murmur appreciated  Gastrointestinal: Abdomen soft, non-distended and non-tender in all 4 quadrants, b, no masses or organomegaly palpated  Psychiatric: Mood and affect are good.  Appropriate groomed with good eye contact    Recent Lab Work:  No results found for this or any previous visit (from the past 336 hour(s)).       Assessment  1. Acute pharyngitis        2. Acute bacterial sinusitis  amoxicillin-clavulanate (AUGMENTIN) 875-125 mg tablet           Plan  1.  Acute pharyngitis- negative rapid strep in office today no exudate noted with exam.  Most likely due to postnasal drainage.  Encouraged her to increase her Flonase to twice a day 1 spray each nostril not to sniff when she is using it.  I have also encouraged salt water gargles and throat drops as needed and use of over-the-counter Tylenol as needed for discomfort or fever.  I have encouraged she increase her fluids and keep humidifier going at home.  She states an understanding we will plan to follow-up as needed or if symptoms  worsen.    2.  Acute bacterial sinusitis- other than the above stated measures with the Flonase increasing fluids and such we will plan to start her on some Augmentin 1 tablet p.o. twice daily for the next 10 days.  I have reviewed possible side effects of Augmentin causing GI issues such as diarrhea and nausea.  I have encouraged that she take the antibiotic after having something to eat.  She states an understanding.  I have encouraged increased fluids.  Tylenol or ibuprofen as needed for fever or aching.  Plan to follow-up in office as needed or if symptoms worsen.

## 2021-06-11 ENCOUNTER — Ambulatory Visit: Payer: PRIVATE HEALTH INSURANCE | Admitting: Primary Care

## 2021-08-14 ENCOUNTER — Other Ambulatory Visit
Admission: RE | Admit: 2021-08-14 | Discharge: 2021-08-14 | Disposition: A | Discharge status: Home / Self Care | Payer: BLUE CROSS/BLUE SHIELD | Source: Ambulatory Visit | Attending: Primary Care | Admitting: Primary Care

## 2021-08-14 DIAGNOSIS — E039 Hypothyroidism, unspecified: Secondary | ICD-10-CM | POA: Insufficient documentation

## 2021-08-14 LAB — T4, FREE: Free T4: 1.2 ng/dL (ref 0.9–1.7)

## 2021-08-14 LAB — TSH: TSH: 2.76 u[IU]/mL (ref 0.27–4.20)

## 2021-08-15 ENCOUNTER — Telehealth: Payer: Self-pay | Admitting: Primary Care

## 2021-08-15 DIAGNOSIS — E039 Hypothyroidism, unspecified: Secondary | ICD-10-CM

## 2021-08-15 LAB — THYROID PEROXIDASE ANTIBODY: Thyroid Peroxidase Ab: 257.6 IU/mL — ABNORMAL HIGH (ref 0.0–8.9)

## 2021-08-15 NOTE — Telephone Encounter (Signed)
Called and spoke to Amanda Phelps on lab results thyroid levels have normalized at this point in time we will continue her at levothyroxine 25 mcg 1 tablet p.o. every morning.  We will recheck her labs in approximately 12 weeks time.  She states an understanding we will have labs rechecked we will plan to keep office visit scheduled in March.  She denies any other questions or concerns today.

## 2021-08-19 ENCOUNTER — Encounter: Payer: Self-pay | Admitting: Primary Care

## 2021-09-02 ENCOUNTER — Emergency Department: Payer: BLUE CROSS/BLUE SHIELD

## 2021-09-02 ENCOUNTER — Other Ambulatory Visit: Payer: Self-pay

## 2021-09-02 ENCOUNTER — Emergency Department
Admission: EM | Admit: 2021-09-02 | Discharge: 2021-09-03 | Disposition: A | Discharge status: Home / Self Care | Payer: BLUE CROSS/BLUE SHIELD | Source: Ambulatory Visit | Attending: Emergency Medicine | Admitting: Emergency Medicine

## 2021-09-02 ENCOUNTER — Ambulatory Visit: Payer: BLUE CROSS/BLUE SHIELD | Attending: Primary Care | Admitting: Primary Care

## 2021-09-02 ENCOUNTER — Encounter: Payer: Self-pay | Admitting: Family Medicine

## 2021-09-02 VITALS — BP 133/88 | HR 103 | Temp 95.8°F | Resp 19

## 2021-09-02 DIAGNOSIS — Y998 Other external cause status: Secondary | ICD-10-CM | POA: Insufficient documentation

## 2021-09-02 DIAGNOSIS — T508X5A Adverse effect of diagnostic agents, initial encounter: Secondary | ICD-10-CM | POA: Insufficient documentation

## 2021-09-02 DIAGNOSIS — T886XXA Anaphylactic reaction due to adverse effect of correct drug or medicament properly administered, initial encounter: Secondary | ICD-10-CM | POA: Insufficient documentation

## 2021-09-02 DIAGNOSIS — Y848 Other medical procedures as the cause of abnormal reaction of the patient, or of later complication, without mention of misadventure at the time of the procedure: Secondary | ICD-10-CM | POA: Insufficient documentation

## 2021-09-02 DIAGNOSIS — Z975 Presence of (intrauterine) contraceptive device: Secondary | ICD-10-CM

## 2021-09-02 DIAGNOSIS — R109 Unspecified abdominal pain: Secondary | ICD-10-CM | POA: Insufficient documentation

## 2021-09-02 DIAGNOSIS — K921 Melena: Secondary | ICD-10-CM | POA: Insufficient documentation

## 2021-09-02 DIAGNOSIS — Y9389 Activity, other specified: Secondary | ICD-10-CM | POA: Insufficient documentation

## 2021-09-02 DIAGNOSIS — R1013 Epigastric pain: Secondary | ICD-10-CM

## 2021-09-02 DIAGNOSIS — K625 Hemorrhage of anus and rectum: Secondary | ICD-10-CM | POA: Insufficient documentation

## 2021-09-02 DIAGNOSIS — X58XXXA Exposure to other specified factors, initial encounter: Secondary | ICD-10-CM | POA: Insufficient documentation

## 2021-09-02 DIAGNOSIS — Y788 Miscellaneous radiological devices associated with adverse incidents, not elsewhere classified: Secondary | ICD-10-CM | POA: Insufficient documentation

## 2021-09-02 DIAGNOSIS — Y92238 Other place in hospital as the place of occurrence of the external cause: Secondary | ICD-10-CM | POA: Insufficient documentation

## 2021-09-02 DIAGNOSIS — R197 Diarrhea, unspecified: Secondary | ICD-10-CM | POA: Insufficient documentation

## 2021-09-02 LAB — CBC AND DIFFERENTIAL
Baso # K/uL: 0 10*3/uL (ref 0.0–0.1)
Basophil %: 0.3 %
Eos # K/uL: 0.2 10*3/uL (ref 0.0–0.4)
Eosinophil %: 1.7 %
Hematocrit: 38 % (ref 34–45)
Hemoglobin: 13.1 g/dL (ref 11.2–15.7)
IMM Granulocytes #: 0 10*3/uL (ref 0.0–0.0)
IMM Granulocytes: 0.2 %
Lymph # K/uL: 1.6 10*3/uL (ref 1.2–3.7)
Lymphocyte %: 18.6 %
MCH: 30 pg (ref 26–32)
MCHC: 34 g/dL (ref 32–36)
MCV: 87 fL (ref 79–95)
Mono # K/uL: 0.7 10*3/uL (ref 0.2–0.9)
Monocyte %: 8.3 %
Neut # K/uL: 6.3 10*3/uL — ABNORMAL HIGH (ref 1.6–6.1)
Nucl RBC # K/uL: 0 10*3/uL (ref 0.0–0.0)
Nucl RBC %: 0 /100 WBC (ref 0.0–0.2)
Platelets: 261 10*3/uL (ref 160–370)
RBC: 4.4 MIL/uL (ref 3.9–5.2)
RDW: 12.6 % (ref 11.7–14.4)
Seg Neut %: 70.9 %
WBC: 8.8 10*3/uL (ref 4.0–10.0)

## 2021-09-02 LAB — COMPREHENSIVE METABOLIC PANEL
ALT: 15 U/L (ref 0–35)
AST: 13 U/L (ref 0–35)
Albumin: 4.6 g/dL (ref 3.5–5.2)
Alk Phos: 67 U/L (ref 35–105)
Anion Gap: 11 (ref 7–16)
Bilirubin,Total: 0.4 mg/dL (ref 0.0–1.2)
CO2: 25 mmol/L (ref 20–28)
Calcium: 9.5 mg/dL (ref 8.8–10.2)
Chloride: 102 mmol/L (ref 96–108)
Creatinine: 0.71 mg/dL (ref 0.51–0.95)
Glucose: 91 mg/dL (ref 60–99)
Lab: 10 mg/dL (ref 6–20)
Potassium: 3.9 mmol/L (ref 3.4–4.7)
Sodium: 138 mmol/L (ref 133–145)
Total Protein: 7.4 g/dL (ref 6.3–7.7)
eGFR BY CREAT: 118 *

## 2021-09-02 LAB — CAMPYLOBACTER PCR

## 2021-09-02 LAB — PROTIME-INR
INR: 1 (ref 0.9–1.1)
Protime: 11.4 s (ref 10.0–12.9)

## 2021-09-02 LAB — SHIGA TOXIN PCR

## 2021-09-02 LAB — HEMATOCRIT: Hematocrit: 34 % (ref 34–45)

## 2021-09-02 LAB — LIPASE: Lipase: 105 U/L — ABNORMAL HIGH (ref 13–60)

## 2021-09-02 LAB — PREGNANCY TEST, SERUM: Preg,Serum: NEGATIVE

## 2021-09-02 LAB — APTT: aPTT: 32.6 s (ref 25.8–37.9)

## 2021-09-02 LAB — SALMONELLA PCR

## 2021-09-02 LAB — HEMOGLOBIN: Hemoglobin: 11.8 g/dL (ref 11.2–15.7)

## 2021-09-02 LAB — SHIGELLA PCR

## 2021-09-02 LAB — MCHC: MCHC: 35 g/dL (ref 32–36)

## 2021-09-02 MED ORDER — PANTOPRAZOLE SODIUM 40 MG IV SOLR *I*
40.0000 mg | Freq: Once | INTRAVENOUS | Status: AC
Start: 2021-09-02 — End: 2021-09-02
  Administered 2021-09-02: 40 mg via INTRAVENOUS
  Filled 2021-09-02: qty 10

## 2021-09-02 MED ORDER — IOHEXOL 350 MG/ML (OMNIPAQUE) IV SOLN *I*
1.0000 mL | Freq: Once | INTRAVENOUS | Status: AC
Start: 2021-09-02 — End: 2021-09-02
  Administered 2021-09-02: 140 mL via INTRAVENOUS

## 2021-09-02 MED ORDER — EPINEPHRINE 1 MG/ML IJ SOLUTION WRAPPED *I*
INTRAMUSCULAR | Status: AC
Start: 2021-09-02 — End: 2021-09-02
  Administered 2021-09-02: 0.3 mg via INTRAMUSCULAR
  Filled 2021-09-02: qty 1

## 2021-09-02 MED ORDER — DIPHENHYDRAMINE HCL 50 MG/ML IJ SOLN *I*
50.0000 mg | Freq: Once | INTRAMUSCULAR | Status: AC
Start: 2021-09-02 — End: 2021-09-02

## 2021-09-02 MED ORDER — DIPHENHYDRAMINE HCL 50 MG/ML IJ SOLN *I*
INTRAMUSCULAR | Status: AC
Start: 2021-09-02 — End: 2021-09-02
  Administered 2021-09-02: 50 mg via INTRAVENOUS
  Filled 2021-09-02: qty 1

## 2021-09-02 MED ORDER — EPINEPHRINE 1 MG/ML IJ SOLUTION WRAPPED *I*
0.3000 mg | Freq: Once | INTRAMUSCULAR | Status: AC
Start: 2021-09-02 — End: 2021-09-02

## 2021-09-02 MED ORDER — SODIUM CHLORIDE 0.9 % IV BOLUS *I*
1000.0000 mL | Freq: Once | Status: AC
Start: 2021-09-02 — End: 2021-09-02
  Administered 2021-09-02: 1000 mL via INTRAVENOUS

## 2021-09-02 MED ORDER — HYDROMORPHONE HCL PF 1 MG/ML IJ SOLN *WRAPPED*
1.0000 mg | Freq: Once | INTRAMUSCULAR | Status: AC
Start: 2021-09-02 — End: 2021-09-02
  Administered 2021-09-02: 1 mg via INTRAVENOUS
  Filled 2021-09-02: qty 1

## 2021-09-02 MED ORDER — ONDANSETRON HCL 2 MG/ML IV SOLN *I*
4.0000 mg | Freq: Once | INTRAMUSCULAR | Status: AC
Start: 2021-09-02 — End: 2021-09-02
  Administered 2021-09-02: 4 mg via INTRAVENOUS
  Filled 2021-09-02: qty 2

## 2021-09-02 MED ORDER — ALBUTEROL SULFATE (2.5 MG/3ML) 0.083% IN NEBU *I*
5.0000 mg | INHALATION_SOLUTION | Freq: Once | RESPIRATORY_TRACT | Status: AC
Start: 2021-09-02 — End: 2021-09-02
  Administered 2021-09-02: 5 mg via RESPIRATORY_TRACT
  Filled 2021-09-02: qty 6

## 2021-09-02 NOTE — ED Notes (Signed)
MD with RN in room to examine. Orth Blood pressures being completed. Per MD would like H&H in two hours after first one drawn

## 2021-09-02 NOTE — ED Provider Notes (Signed)
History     Chief Complaint   Patient presents with    Rectal Bleeding     Has had abdominal cramping since yesterday with some rectal bleeding, bleeding continues today. States blood is dark red with clots. Has been bleeding all days. Has some worsening cramps and nausea.          Amanda Phelps is a 31 year old referred down from urgent care.  She had been fine till yesterday afternoon when she developed epigastric cramping with some radiation to the back.  She subsequently developed diarrhea.  It was a nondescript brownish color but turned into a deep red but not maroon color.  That happened today.    There is been occasional nausea but no vomiting.  She has been taking occasional ibuprofen every other day also for the last several days.  She has not been on antibiotics.    She denies the risk of pregnancy.    Volume of blood passed per rectum has been modest.  No clots.  She had no pelvic cramping all of the discomfort seems to have been in the epigastrium.  She was seen about an hour after arrival.  Vital signs are good she is awake alert conversational.  In no severe distress.            Medical/Surgical/Family History     Past Medical History:   Diagnosis Date    ADD (attention deficit disorder)     not formerly diagnosed    Anxiety disorder         Patient Active Problem List   Diagnosis Code    Breast asymmetry in female N25.89    Viral URI with cough J06.9    Anxiety disorder F41.9    Migraine G43.909            Past Surgical History:   Procedure Laterality Date    TONSILLECTOMY AND ADENOIDECTOMY       Family History   Problem Relation Age of Onset    Cancer Maternal Grandfather         Lung cancer, smoker    Heart failure Maternal Grandfather     Thyroid disease Maternal Grandfather     Heart failure Maternal Grandmother     Diabetes Maternal Grandmother     Anemia Maternal Grandmother     COPD Maternal Grandmother     Osteoporosis Maternal Aunt     Depression Maternal Aunt     Thyroid  disease Mother     Diabetes Mother     COPD Mother     Rheum arthritis Mother     Diverticulitis Father           Social History     Tobacco Use    Smoking status: Never    Smokeless tobacco: Never   Substance Use Topics    Alcohol use: Yes     Comment: socially     Drug use: Yes     Types: Marijuana     Comment: occasional     Living Situation     Questions Responses    Patient lives with     Homeless     Caregiver for other family member     External Services     Employment     Domestic Violence Risk                 Review of Systems   Review of Systems   Constitutional: Positive for activity change and appetite change. Negative for  chills, fever and unexpected weight change.   HENT: Negative for trouble swallowing and voice change.    Eyes: Negative for pain and visual disturbance.   Respiratory: Negative for cough, chest tightness and shortness of breath.    Cardiovascular: Negative for chest pain, palpitations and leg swelling.   Gastrointestinal: Positive for abdominal pain, blood in stool and diarrhea. Negative for nausea and vomiting.   Genitourinary: Negative for dysuria, flank pain, frequency and pelvic pain.   Musculoskeletal: Negative for arthralgias, back pain, neck pain and neck stiffness.   Skin: Negative for color change and rash.   Neurological: Negative for dizziness, syncope and light-headedness.   Hematological: Negative for adenopathy. Does not bruise/bleed easily.   Psychiatric/Behavioral: Negative for agitation, behavioral problems and confusion. The patient is not nervous/anxious.        Physical Exam     Triage Vitals      First Recorded BP: 130/78, Resp: 18, Temp: 36.7 C (98.1 F), Temp src: TEMPORAL Oxygen Therapy SpO2: 98 %, Oximetry Source: Lt Hand, O2 Device: None (Room air), Heart Rate: 94, (09/02/21 1800) Heart Rate (via Pulse Ox): 94, (09/02/21 1800).  First Pain Reported  0-10 Scale: 9, Pain Location/Orientation: Abdomen, Pain Descriptors: Burning;Dull, (09/02/21 1800)        Physical Exam  Vitals and nursing note reviewed. Exam conducted with a chaperone present.   Constitutional:       General: She is not in acute distress.     Appearance: Normal appearance. She is not ill-appearing, toxic-appearing or diaphoretic.      Comments: Awake alert conversational and smiling.  Mild epigastric pain.   HENT:      Head: Normocephalic.      Right Ear: Tympanic membrane normal.      Left Ear: Tympanic membrane normal.      Mouth/Throat:      Mouth: Mucous membranes are moist.      Pharynx: No posterior oropharyngeal erythema.   Eyes:      General: No scleral icterus.     Extraocular Movements: Extraocular movements intact.   Cardiovascular:      Rate and Rhythm: Normal rate and regular rhythm.      Pulses: Normal pulses.      Heart sounds: Normal heart sounds.   Pulmonary:      Effort: Pulmonary effort is normal.      Breath sounds: Normal breath sounds.   Abdominal:      General: Abdomen is flat.      Palpations: Abdomen is soft.      Tenderness: There is abdominal tenderness. There is no right CVA tenderness or left CVA tenderness.   Musculoskeletal:         General: No swelling or tenderness. Normal range of motion.      Cervical back: Normal range of motion and neck supple.   Skin:     General: Skin is warm and dry.      Capillary Refill: Capillary refill takes less than 2 seconds.   Neurological:      General: No focal deficit present.      Mental Status: She is alert and oriented to person, place, and time.      Cranial Nerves: No cranial nerve deficit.      Sensory: No sensory deficit.   Psychiatric:         Mood and Affect: Mood normal.         Behavior: Behavior normal.         Medical  Decision Making     Assessment:  Progression of epigastric discomfort, diarrhea turning into off red blood per rectum past 24 hours.  She is doing some nonsteroidal anti-inflammatory drugs.  She has no history of GI syndromes.  She has had no abdominal surgeries.    Differential diagnosis:  Peptic  ulcer disease gastritis pancreatitis.  It is possible she has an upper GI bleed in the middle of all this but the hemodynamics are good.  Orthostats are good.  She may have a colitis.  She has no history of gastrointestinal syndromes on a recurrent basis    Plan:  IV access cautious fluids.  Analgesia.  She is not actively nauseated but I gave some Zofran empirically.  Labs.  She will need a 2-hour hemoglobin.  Given Protonix 40 mg IV she will need a CT scan at some point looking for lower GI disease                Laurene Footman, MD             Laurene Footman, MD  09/02/21 [phone]

## 2021-09-02 NOTE — ED Provider Progress Notes (Signed)
ED Provider Progress Note    I assumed care of this patient from the outgoing provider.  Briefly she is a 31 year old female who presents to the emergency department with diarrhea, epigastric discomfort for the past 24 hours.  No significant nausea.  Blood work was relatively unremarkable.  Patient with no further diarrhea in the emergency department. Signed out pending CT scan  ED Course as of 09/03/21 0242   Tue Sep 02, 2021   2227 Pt w/ anaphylaxis reaction to contrast dye. Returns from CT with wheezing, throat closing, voice changes, and urticaria. IM epi, 50mg  IV benadryl and albuterol given with improvement. Resolution of wheezing, redness and itching calmed and throat swelling improved   Wed Sep 03, 2021   0109 Patient feeling much better, throat swelling and wheezing resolved with epi/benadryl/albuterol. Patient awaiting CT results   0146 [facility]ology paged again for read   0237 CT scan shows small area of bleeding in the lower rectum as well as a slightly misplaced IUD.  Patient with no active bleeding in the emergency department.  Hemodynamically has been stable.  Repeat H&H after liter of fluids is decreased to 12 from 13 which is consistent with fluid administration.  Abdominal pain has largely resolved.  Respiratory symptoms secondary to contrast reaction have also resolved.  Plan for discharge at this time.  I did counsel the patient to return to the emergency department for bright red blood per rectum, feeling weak, syncope, or worsening pain        Orvan Seen, MD, 09/02/2021, 11:34 PM     Orvan Seen, MD  09/03/21 [phone]

## 2021-09-02 NOTE — Progress Notes (Signed)
RN in to check on patient, patient stating that she is feeling a lot better. Patient reporting that her airway/throat are not as tight and her breathing is easier. RN notes patient is having no difficulty breathing, vitals are stable; her call bell is in reach

## 2021-09-02 NOTE — ED Notes (Signed)
Came from UC due to bleeding rectally states started yesterday with dark red blood this am , dull crampy pain with chills fully alert, looks well

## 2021-09-02 NOTE — ED Triage Notes (Signed)
Started yesterday with abdominal cramps in upper abdomen, then started having some rectal bleeding with upper abdominal cramping. Continues yoday with dark red bleeding with clots, Upper abdominal cramps are progressively getting worse with nausea. No vomiting.               Prehospital medications given: Yes  Other: Pepto-Bismol

## 2021-09-02 NOTE — Nursing Note (Signed)
Patient presents to UC with nausea, abdominal cramping, diarrhea about every 45 minutes. Patient states she has had some darker blood with clots in her bowel movements today.

## 2021-09-02 NOTE — UC Provider Note (Signed)
History     Chief Complaint   Patient presents with    Abdominal Cramping     Patient is a 31 y.o. female who presents with epigastric pain since yesterday. She describes severe pain waxing and waning. She has had about 4 very loose BMs over the last 12 hours now with bloody hematochezia several times over the last couple hours. She describes blood in the toilet bowel as well as on the tissue paper. She does report some nausea and feeling generally unwell. She has not vomited. She has not had a fever. She denies any cold symptoms.          Medical/Surgical/Family History     Past Medical History:   Diagnosis Date    ADD (attention deficit disorder)     not formerly diagnosed    Anxiety disorder         Patient Active Problem List   Diagnosis Code    Breast asymmetry in female N65.89    Viral URI with cough J06.9    Anxiety disorder F41.9    Migraine G43.909            Past Surgical History:   Procedure Laterality Date    TONSILLECTOMY AND ADENOIDECTOMY       Family History   Problem Relation Age of Onset    Cancer Maternal Grandfather         Lung cancer, smoker    Heart failure Maternal Grandfather     Thyroid disease Maternal Grandfather     Heart failure Maternal Grandmother     Diabetes Maternal Grandmother     Anemia Maternal Grandmother     COPD Maternal Grandmother     Osteoporosis Maternal Aunt     Depression Maternal Aunt     Thyroid disease Mother     Diabetes Mother     COPD Mother     Rheum arthritis Mother     Diverticulitis Father           Social History     Tobacco Use    Smoking status: Never    Smokeless tobacco: Never   Substance Use Topics    Alcohol use: Yes     Comment: socially     Drug use: Yes     Types: Marijuana     Comment: occasional     Living Situation     Questions Responses    Patient lives with     Homeless     Caregiver for other family member     External Services     Employment     Domestic Violence Risk                 Review of Systems   Review of  Systems   Constitutional: Positive for activity change, appetite change, chills and fatigue. Negative for fever.   HENT: Negative.    Respiratory: Negative.    Cardiovascular: Negative.    Gastrointestinal: Positive for abdominal pain, anal bleeding, blood in stool, diarrhea and nausea. Negative for vomiting.   Genitourinary: Negative for decreased urine volume.   Skin: Negative for rash.       Physical Exam   Vitals      First Recorded BP: 133/88, Resp: 19, Temp: 35.4 C (95.8 F) Oxygen Therapy SpO2: 99 %, Heart Rate: 103, (09/02/21 1632)  .      Physical Exam  Vitals and nursing note reviewed.   Constitutional:       General: She  is not in acute distress.     Appearance: Normal appearance. She is normal weight. She is not ill-appearing, toxic-appearing or diaphoretic.   HENT:      Head: Normocephalic and atraumatic.   Eyes:      General: No scleral icterus.        Right eye: No discharge.         Left eye: No discharge.      Extraocular Movements: Extraocular movements intact.      Conjunctiva/sclera: Conjunctivae normal.      Pupils: Pupils are equal, round, and reactive to light.   Cardiovascular:      Rate and Rhythm: Normal rate and regular rhythm.      Heart sounds: Normal heart sounds.   Pulmonary:      Effort: Pulmonary effort is normal. No respiratory distress.      Breath sounds: Normal breath sounds.   Abdominal:      Palpations: There is no mass.      Tenderness: There is abdominal tenderness in the right upper quadrant, epigastric area and left upper quadrant. There is guarding. There is no right CVA tenderness, left CVA tenderness or rebound.       Musculoskeletal:      Cervical back: Normal range of motion.   Skin:     General: Skin is warm.      Findings: No rash.   Neurological:      General: No focal deficit present.      Mental Status: She is alert.          Medical Decision Making   Medical Decision Making  Assessment:    Epigastric Pain, Hematochezia    Differential diagnosis:     Gastritis/Gastric Ulcer, Pancreatitis, Colitis, Diverticulitis    Plan and Results:    Discussed with patient that we are limited as far as resources to evaluate her pain. Given the severe nature of it and described hematochezia, recommended that she see further evaluation at the ER (labs, imaging) to rule out more severe underlying cause.        Diagnosis and Disposition:       Return precautions discussed and provided on AVS.      Final Diagnosis    ICD-10-CM ICD-9-CM   1. Epigastric pain  R10.13 789.06   2. Hematochezia  K92.1 578.1         Amadeo Garnet, [place]

## 2021-09-03 NOTE — ED Notes (Signed)
RN in to check on pt. Pt states she is feeling well. No S/S of resp distress.

## 2021-09-03 NOTE — Discharge Instructions (Signed)
You were seen in the ED for diarrhea and rectal bleeding. You were evaluated and your CT shows a healthy colon with a small area of bleeding around the rectum. Your vitals and blood counts are stable and okay at this time. You can take pepto bismol or gas-x to help with your stomach pain and diarrhea. Please return to the ED for worsening fever, bleeding, feeling lightheaded or passing out or any other symptoms concerning to you

## 2021-10-27 ENCOUNTER — Ambulatory Visit: Payer: BLUE CROSS/BLUE SHIELD | Admitting: Primary Care

## 2021-11-03 ENCOUNTER — Other Ambulatory Visit: Payer: Self-pay | Admitting: Primary Care

## 2021-11-03 DIAGNOSIS — E039 Hypothyroidism, unspecified: Secondary | ICD-10-CM

## 2021-11-10 ENCOUNTER — Encounter: Payer: Self-pay | Admitting: Primary Care

## 2021-11-10 ENCOUNTER — Telehealth: Payer: Self-pay | Admitting: Primary Care

## 2021-11-10 ENCOUNTER — Ambulatory Visit: Payer: BLUE CROSS/BLUE SHIELD | Attending: Primary Care | Admitting: Primary Care

## 2021-11-10 ENCOUNTER — Other Ambulatory Visit
Admission: RE | Admit: 2021-11-10 | Discharge: 2021-11-10 | Disposition: A | Discharge status: Home / Self Care | Payer: BLUE CROSS/BLUE SHIELD | Source: Ambulatory Visit | Attending: Primary Care | Admitting: Primary Care

## 2021-11-10 ENCOUNTER — Other Ambulatory Visit: Payer: Self-pay

## 2021-11-10 VITALS — BP 118/76 | HR 84 | Temp 97.6°F | Ht 70.0 in | Wt 261.2 lb

## 2021-11-10 DIAGNOSIS — E039 Hypothyroidism, unspecified: Secondary | ICD-10-CM | POA: Insufficient documentation

## 2021-11-10 DIAGNOSIS — R748 Abnormal levels of other serum enzymes: Secondary | ICD-10-CM | POA: Insufficient documentation

## 2021-11-10 DIAGNOSIS — F419 Anxiety disorder, unspecified: Secondary | ICD-10-CM | POA: Insufficient documentation

## 2021-11-10 DIAGNOSIS — K625 Hemorrhage of anus and rectum: Secondary | ICD-10-CM | POA: Insufficient documentation

## 2021-11-10 DIAGNOSIS — G43909 Migraine, unspecified, not intractable, without status migrainosus: Secondary | ICD-10-CM | POA: Insufficient documentation

## 2021-11-10 DIAGNOSIS — K130 Diseases of lips: Secondary | ICD-10-CM | POA: Insufficient documentation

## 2021-11-10 LAB — COMPREHENSIVE METABOLIC PANEL
ALT: 17 U/L (ref 0–35)
AST: 18 U/L (ref 0–35)
Albumin: 4.4 g/dL (ref 3.5–5.2)
Alk Phos: 60 U/L (ref 35–105)
Anion Gap: 10 (ref 7–16)
Bilirubin,Total: 0.3 mg/dL (ref 0.0–1.2)
CO2: 27 mmol/L (ref 20–28)
Calcium: 9.7 mg/dL (ref 8.8–10.2)
Chloride: 102 mmol/L (ref 96–108)
Creatinine: 0.69 mg/dL (ref 0.51–0.95)
Glucose: 85 mg/dL (ref 60–99)
Lab: 11 mg/dL (ref 6–20)
Potassium: 4.2 mmol/L (ref 3.3–4.6)
Sodium: 139 mmol/L (ref 133–145)
Total Protein: 7.1 g/dL (ref 6.3–7.7)
eGFR BY CREAT: 120 *

## 2021-11-10 LAB — LIPID PANEL
Chol/HDL Ratio: 2.9
Cholesterol: 155 mg/dL
HDL: 54 mg/dL (ref 40–60)
LDL Calculated: 84 mg/dL
Non HDL Cholesterol: 101 mg/dL
Triglycerides: 87 mg/dL

## 2021-11-10 LAB — CBC AND DIFFERENTIAL
Baso # K/uL: 0 10*3/uL (ref 0.0–0.1)
Basophil %: 0.4 %
Eos # K/uL: 0.1 10*3/uL (ref 0.0–0.4)
Eosinophil %: 0.8 %
Hematocrit: 39 % (ref 34–45)
Hemoglobin: 13 g/dL (ref 11.2–15.7)
IMM Granulocytes #: 0 10*3/uL (ref 0.0–0.0)
IMM Granulocytes: 0.4 %
Lymph # K/uL: 1.6 10*3/uL (ref 1.2–3.7)
Lymphocyte %: 16.4 %
MCH: 30 pg (ref 26–32)
MCHC: 34 g/dL (ref 32–36)
MCV: 90 fL (ref 79–95)
Mono # K/uL: 0.6 10*3/uL (ref 0.2–0.9)
Monocyte %: 5.8 %
Neut # K/uL: 7.5 10*3/uL — ABNORMAL HIGH (ref 1.6–6.1)
Nucl RBC # K/uL: 0 10*3/uL (ref 0.0–0.0)
Nucl RBC %: 0 /100 WBC (ref 0.0–0.2)
Platelets: 255 10*3/uL (ref 160–370)
RBC: 4.3 MIL/uL (ref 3.9–5.2)
RDW: 12.8 % (ref 11.7–14.4)
Seg Neut %: 76.2 %
WBC: 9.9 10*3/uL (ref 4.0–10.0)

## 2021-11-10 LAB — TSH: TSH: 2.82 u[IU]/mL (ref 0.27–4.20)

## 2021-11-10 LAB — LIPASE: Lipase: 96 U/L — ABNORMAL HIGH (ref 13–60)

## 2021-11-10 LAB — T4, FREE: Free T4: 1 ng/dL (ref 0.9–1.7)

## 2021-11-10 NOTE — Telephone Encounter (Signed)
Follow-up disposition: Follow up in about 6 months (around 05/12/2022) for Yearly Check-up.

## 2021-11-10 NOTE — Progress Notes (Signed)
CC:   Chief Complaint   Patient presents with   . Follow-up     47mo FUV       HPI: Amanda Phelps is a 31 y.o. female who presented today for 75-month follow-up.  Chronic health issues include migraine, anxiety disorder, hypothyroidism, breast dissymmetry.  Overall she states that she is feeling much better since starting thyroid medication.  Labs were not done prior to visit she will plan to get them done after her visit today and we will retouch on thyroid levels once results return via phone.  I encouraged that there is a great importance in keeping track of her thyroid levels as this is probably going to be medication is going to go up and down throughout her lifetime and we need to continue with following up on her thyroid levels in order to make sure that were not going too high or too low with her thyroid medication.  She states understanding is agreeable.  She denies any chest pain or heart palpitations, racing heart rate or tremors.  She states overall she feels more alert she feels like she is more able to voice her thoughts whereas previously she was having a lot of foggy brain issues going on.  She notes that she is started exercising more and her migraine headaches have dissipated.  She does have concerns of a dark area on her upper lip she is.  She notes that this started approximately 11 years ago.  She states that every now and then it will swell in size and become very painful and then will go back down to just a small little raised area.  She states previously she had been told that it may be a blood vessel lying just under her lip so she is let it go for several years but would really like this looked into to make sure that that is what it is and its not cancerous.  She denies any concerns today.    Review of Systems:  General: No fever, chills, lightheadedness, dizziness, weight loss, night sweats  HEENT: Denies PND, or sore throat, no difficulties swallowing  Pulmonary: No cough, sputum,  wheezing, shortness of breath, dyspnea on exertion, hemoptysis   Cardiovascular: No chest pain, PND, orthopnea, palpitations,   Gastrointestinal: No nausea, vomiting, abdominal pain,   cramping, diarrhea, constipation,        Medications reviewed in eRecord today and confirmed with the patient with No changes being made  Medications:    Current Outpatient Medications:   .  levothyroxine (SYNTHROID, LEVOTHROID) 25 mcg tablet, TAKE 1 TABLET(25 MCG) BY MOUTH DAILY BEFORE AND BREAKFAST, Disp: 30 tablet, Rfl: 5  .  FLUoxetine (PROZAC) 20 mg capsule, Take 1 capsule (20 mg total) by mouth daily (Patient taking differently: Take 2 capsules (40 mg total) by mouth daily), Disp: 30 capsule, Rfl: 2  .  levonorgestrel, MIRENA, (MIRENA, 52 MG,) 20 MCG/24HR IUD, Mirena 20 mcg/24 hours (7 yrs) 52 mg intrauterine device  Take by intrauterine route., Disp: , Rfl:   .  albuterol HFA (PROVENTIL, VENTOLIN, PROAIR HFA) 108 (90 Base) MCG/ACT inhaler, INHALE 1-2 PUFFS EVERY 6 HOURS AS NEEDED FOR WHEEZING FOR SPASM OF LUNG AIR PASSAGES SHAKE WELL BEFORE EACH USE, Disp: 8.5 g, Rfl: 3  .  valACYclovir (VALTREX) 1 gm tablet, SMARTSIG:2 Tablet(s) By Mouth Every 12 Hours, Disp: , Rfl:     Allergies:  Allergy History as of 11/10/21     AZITHROMYCIN  Noted Status Severity Type Reaction    07/25/12 1300 Boxx, [name] 07/25/12 Active Low Allergy Rash          SULFAMETHOXAZOLE-TRIMETHOPRIM       Noted Status Severity Type Reaction    07/25/12 1300 Boxx, [name] 07/25/12 Active Low Allergy Rash          CONTRAST DYE       Noted Status Severity Type Reaction    09/02/21 2217 Karsten Ro, RN 09/02/21 Active High Allergy Anaphylaxis                Family History:  Family History   Problem Relation Age of Onset   . Cancer Maternal Grandfather         Lung cancer, smoker   . Heart failure Maternal Grandfather    . Thyroid disease Maternal Grandfather    . Heart failure Maternal Grandmother    . Diabetes Maternal Grandmother    . Anemia Maternal  Grandmother    . COPD Maternal Grandmother    . Osteoporosis Maternal Aunt    . Depression Maternal Aunt    . Thyroid disease Mother    . Diabetes Mother    . COPD Mother    . Rheum arthritis Mother    . Diverticulitis Father        Social History:  Social History     Socioeconomic History   . Marital status: Significant Other   . Number of children: 1   . Years of education: [employer]   Occupational History   . Occupation: Financial risk analyst     Employer: [employer]   Tobacco Use   . Smoking status: Never   . Smokeless tobacco: Never   Substance and Sexual Activity   . Alcohol use: Yes     Comment: socially    . Drug use: Yes     Types: Marijuana     Comment: occasional   . Sexual activity: Yes     Partners: Male     Birth control/protection: I.U.D.     Comment: IUD palced 2019   Social History Narrative    Psychosocial Evaluation:        Lives with roommate and parents    Abuse: No    Depression symptoms:  no    School:  [employer] [place]    Patient is doing well in school:  yes    Grades:  Teaching laboratory technician or learning problems:  no    Peer relationships:  Good friends and boyfriend treats her well.    Family relationships:  Good    Future goals:  Marketing rep                 Vital Signs:  Vitals:    11/10/21 1447   BP: 118/76   Pulse: 84   Temp: 36.4 C (97.6 F)   Weight: 118.5 kg (261 lb 4 oz)   Height: 1.778 m (5\' 10" )     Last 4 Weights    11/10/21 1447   Weight: 118.5 kg (261 lb 4 oz)       Physical Exam:  General: Well developed/well nourished female in no acute distress   Pulmonary: Lungs clear to auscultation bilaterally with normal air entry, normal to percussion bilaterally, no adventitious breath sounds  Cardiovascular: S1/S2, regular rate/rhythm, no murmur appreciated  Psychiatric: Mood and affect are good.  Appropriate groomed with good eye contact    Recent Lab Work:  Recent  Results (from the past 336 hour(s))   Comprehensive metabolic panel    Collection Time: 11/10/21  3:22 PM   Result  Value Ref Range    Sodium 139 133 - 145 mmol/L    Potassium 4.2 3.3 - 4.6 mmol/L    Chloride 102 96 - 108 mmol/L    CO2 27 20 - 28 mmol/L    Anion Gap 10 7 - 16    UN 11 6 - 20 mg/dL    Creatinine 2.95 6.21 - 0.95 mg/dL    eGFR BY CREAT 308 *    Glucose 85 60 - 99 mg/dL    Calcium 9.7 8.8 - 65.7 mg/dL    Total Protein 7.1 6.3 - 7.7 g/dL    Albumin 4.4 3.5 - 5.2 g/dL    Bilirubin,Total 0.3 0.0 - 1.2 mg/dL    AST 18 0 - 35 U/L    ALT 17 0 - 35 U/L    Alk Phos 60 35 - 105 U/L   CBC and differential    Collection Time: 11/10/21  3:22 PM   Result Value Ref Range    WBC 9.9 4.0 - 10.0 THOU/uL    RBC 4.3 3.9 - 5.2 MIL/uL    Hemoglobin 13.0 11.2 - 15.7 g/dL    Hematocrit 39 34 - 45 %    MCV 90 79 - 95 fL    MCH 30 26 - 32 pg    MCHC 34 32 - 36 g/dL    RDW 84.6 96.2 - 95.2 %    Platelets 255 160 - 370 THOU/uL    Seg Neut % 76.2 %    Lymphocyte % 16.4 %    Monocyte % 5.8 %    Eosinophil % 0.8 %    Basophil % 0.4 %    Neut # K/uL 7.5 (H) 1.6 - 6.1 THOU/uL    Lymph # K/uL 1.6 1.2 - 3.7 THOU/uL    Mono # K/uL 0.6 0.2 - 0.9 THOU/uL    Eos # K/uL 0.1 0.0 - 0.4 THOU/uL    Baso # K/uL 0.0 0.0 - 0.1 THOU/uL    Nucl RBC % 0.0 0.0 - 0.2 /100 WBC    Nucl RBC # K/uL 0.0 0.0 - 0.0 THOU/uL    IMM Granulocytes # 0.0 0.0 - 0.0 THOU/uL    IMM Granulocytes 0.4 %   Lipase    Collection Time: 11/10/21  3:22 PM   Result Value Ref Range    Lipase 96 (H) 13 - 60 U/L          Assessment  1. Hypothyroidism, unspecified type  TSH    T4, free      2. Anxiety        3. Migraine        4. Rectal bleeding  CBC and differential    Comprehensive metabolic panel      5. Elevated lipase  Lipase    Lipid Panel (Reflex to Direct  LDL if Triglycerides more than 400)      6. Lip lesion  AMB eConsult to Adult Dermatology (Primary Care Use Only)             Plan  1.  Hypothyroidism unspecified-TSH and T4 ordered.  We will plan to continue with levothyroxine 25 mcg p.o. daily.  I will plan to touch base with her when labs return via phone and any plan of care  changes needed will be done at that point in time.  We will plan to follow-up in office in 6 months and as needed    2.Anxiety-states well controlled at this point in time is dissipated quite a bit since starting her thyroid medication.  Overall she states she is feeling great she denies any SI or HI we will continue to monitor follow-up in office in 6 months and as needed    3.  Migraine- states well controlled at this time since starting thyroid medication denies having migraines at this point.  We will continue to monitor follow-up in office in 6 months and as needed    4.  Rectal bleeding- notes resolved at this time.  Unresolved distal why this happened in the first place possibly could have been from internal hemorrhoid.  We will continue to monitor.  She will contact office if she is having any other issues with any rectal bleeding.  She denies any dark stools.  Continue to monitor follow-up in office in 6 months and as needed    5.  Elevated lipase Labs ordered we will plan to reevaluate when all labs return.  I will call her on the phone.  We will plan office visit in 6 months and as needed    6.  Lip lesion- she does have concerns of entire area on her upper lip.  She states its been there for approximately 11 years now.  She notes that at some point sometimes it will actually swell and enlarge and be painful other times it will just be discolored.  We discussed whether or not to do a dermatology referral or an E consult to dermatology she is agreeable to do an E consult and is aware that she may be charged for this visit as a specialty visit.  If they need to see her in office there will be no charge for the visit.  She did consent for pictures to be taken today and sent and pictures were attached to the chart.  We will plan to follow-up when results return from dermatology.  Follow-up as needed

## 2021-11-11 ENCOUNTER — Other Ambulatory Visit: Payer: BLUE CROSS/BLUE SHIELD | Admitting: Dermatology

## 2021-11-11 ENCOUNTER — Telehealth: Payer: Self-pay | Admitting: Primary Care

## 2021-11-11 ENCOUNTER — Encounter: Payer: Self-pay | Admitting: Primary Care

## 2021-11-11 DIAGNOSIS — R748 Abnormal levels of other serum enzymes: Secondary | ICD-10-CM

## 2021-11-11 DIAGNOSIS — K625 Hemorrhage of anus and rectum: Secondary | ICD-10-CM

## 2021-11-11 DIAGNOSIS — L989 Disorder of the skin and subcutaneous tissue, unspecified: Secondary | ICD-10-CM

## 2021-11-11 DIAGNOSIS — E039 Hypothyroidism, unspecified: Secondary | ICD-10-CM

## 2021-11-11 NOTE — Telephone Encounter (Signed)
Labs reviewed, lipase remains elevated though decreased from previous, Thyroid labs look good and WNL, Lipid panel and kidney function look good. Lipase might be elevated d/t gallbladder issues. Encouraged to watch fats and spicy foods in diet and I would like to repeat labs just before next visit.

## 2021-11-11 NOTE — Telephone Encounter (Signed)
My chart message sent as phone numbers are invalid

## 2021-11-11 NOTE — Telephone Encounter (Signed)
Patient verbalizes understanding of below information.

## 2021-11-12 ENCOUNTER — Telehealth: Payer: Self-pay | Admitting: Primary Care

## 2021-11-12 ENCOUNTER — Encounter: Payer: Self-pay | Admitting: Primary Care

## 2021-11-12 DIAGNOSIS — L989 Disorder of the skin and subcutaneous tissue, unspecified: Secondary | ICD-10-CM

## 2021-11-12 NOTE — Provider Consult (Signed)
Consulting Provider Response     Dermatology E-Consult    Question    Lesion on upper lip    History  Received From Referring Provider    Photographs  Reviewed in Media Tab    Quality  Good    Please visit the following website to see an excellent and concise guide for taking medical photographs:  [email or web address]    Assessment    Skin lesion, benign    Triage  Recommend Normal Referral    Plan    This is most likely a benign skin lesion given the 11 year duration of a stable lesion.  I would consider a mucocele, although the location on the upper lip is less common than the lower lip. If she wishes an evaluation for help with diagnosis, she can be referred routinely.    Otherwise, if she wishes to have it removed, ENT or plastic surgery would be better service lines given the location.      Time Spent: 5 minutes    This eConsult is focused on the specific clinical question(s) asked by the referring clinician, is based on the clinical data available to me, the consulting physician at the time off the request, and is furnished without benefit of a comprehensive evaluation of physical examination of the patient by me. The guidance set forth in the eConsult note will need to be interpreted in light of any clinical issues not known to me or any changes in patient status that I may not be aware of at the time of filing this eConsult. If further consultation is necessary, an in-person visit with me or another member of our group is an option.    Burnard Leigh, MD  Dermatology Attending

## 2021-11-12 NOTE — Telephone Encounter (Signed)
MyChart message sent to review dermatology results.  We will await response to determine whether she will go to ENT, plastic surgery or back to dermatology for definitive examination

## 2021-11-13 NOTE — Addendum Note (Signed)
Addended by: Lowry Ram on: 11/13/2021 06:51 AM     Modules accepted: Orders

## 2021-11-25 ENCOUNTER — Encounter: Payer: Self-pay | Admitting: Primary Care

## 2021-12-26 ENCOUNTER — Ambulatory Visit: Payer: BLUE CROSS/BLUE SHIELD | Attending: Primary Care | Admitting: Primary Care

## 2021-12-26 ENCOUNTER — Encounter: Payer: Self-pay | Admitting: Primary Care

## 2021-12-26 ENCOUNTER — Other Ambulatory Visit: Payer: Self-pay

## 2021-12-26 VITALS — BP 111/79 | HR 82 | Temp 98.0°F | Ht 70.0 in | Wt 251.8 lb

## 2021-12-26 DIAGNOSIS — H6693 Otitis media, unspecified, bilateral: Secondary | ICD-10-CM | POA: Insufficient documentation

## 2021-12-26 DIAGNOSIS — J329 Chronic sinusitis, unspecified: Secondary | ICD-10-CM | POA: Insufficient documentation

## 2021-12-26 MED ORDER — AMOXICILLIN-POT CLAVULANATE 875-125 MG PO TABS *I*
1.0000 | ORAL_TABLET | Freq: Two times a day (BID) | ORAL | 0 refills | Status: DC
Start: 2021-12-26 — End: 2022-07-23

## 2021-12-26 NOTE — Progress Notes (Signed)
CC:   Chief Complaint   Patient presents with   . Other     Sinus pain and pressure       HPI: Amanda Phelps is a 31 y.o. female who presented for evaluation of possible sinus infection.    She states that she had symptoms for about a week and a half.  It for started is possible allergies but then her son got sick and she got worse.  She reports postnasal drip, sinus pain and pressure and ear pressure with crackling and popping.  She has nasal congestion and is blowing out yellow thick snot.  She is also coughing up some phlegm due to postnasal drip.  Her throat feels raw because of this.  She started with a low-grade temperature last evening and some chills.  She took some ibuprofen with minimal improvement in symptoms.  She has taken several at home COVID tests that are negative.    Review of Systems:   See HPI    Allergy History as of 12/26/21     AZITHROMYCIN       Noted Status Severity Type Reaction    07/25/12 1300 Boxx, [name] 07/25/12 Active Low Allergy Rash          SULFAMETHOXAZOLE-TRIMETHOPRIM       Noted Status Severity Type Reaction    07/25/12 1300 Boxx, [place] O 07/25/12 Active Low Allergy Rash          CONTRAST DYE       Noted Status Severity Type Reaction    09/02/21 2217 Karsten Ro, RN 09/02/21 Active High Allergy Anaphylaxis                Medications reviewed in eRecord today and confirmed with the patient with Yes changes being made    Current Outpatient Medications on File Prior to Visit   Medication Sig Dispense Refill   . levothyroxine (SYNTHROID, LEVOTHROID) 25 mcg tablet TAKE 1 TABLET(25 MCG) BY MOUTH DAILY BEFORE AND BREAKFAST 30 tablet 5   . FLUoxetine (PROZAC) 20 mg capsule Take 1 capsule (20 mg total) by mouth daily (Patient taking differently: Take 2 capsules (40 mg total) by mouth daily) 30 capsule 2   . levonorgestrel, MIRENA, (MIRENA, 52 MG,) 20 MCG/24HR IUD Mirena 20 mcg/24 hours (7 yrs) 52 mg intrauterine device   Take by intrauterine route.     Marland Kitchen albuterol HFA  (PROVENTIL, VENTOLIN, PROAIR HFA) 108 (90 Base) MCG/ACT inhaler INHALE 1-2 PUFFS EVERY 6 HOURS AS NEEDED FOR WHEEZING FOR SPASM OF LUNG AIR PASSAGES SHAKE WELL BEFORE EACH USE 8.5 g 3   . valACYclovir (VALTREX) 1 gm tablet SMARTSIG:2 Tablet(s) By Mouth Every 12 Hours       No current facility-administered medications on file prior to visit.       History obtained from chart review and the patient.    Past Medical History:   Diagnosis Date   . ADD (attention deficit disorder)     not formerly diagnosed   . Anxiety disorder        Vitals:    12/26/21 1132   BP: 111/79   BP Location: Left arm   Patient Position: Sitting   Cuff Size: adult   Pulse: 82   Temp: 36.7 C (98 F)   TempSrc: Temporal   SpO2: 97%   Weight: 114.2 kg (251 lb 12.8 oz)   Height: 1.778 m (5\' 10" )     Body mass index is 36.13 kg/m.  Physical Examination:  General appearance - alert, well appearing, and in no distress  Mental status - alert, oriented to person, place, and time, normal mood, behavior, speech, dress, motor activity, and thought processes  Eyes - pupils equal and reactive, extraocular eye movements intact, sclera anicteric  Ears - right TM red, dull, bulging, left TM red, dull, bulging  Nose - mucosal congestion, clear rhinorrhea and sinus tenderness noted bilateral maxillary sinuses  Mouth - mucous membranes moist, pharynx normal without lesions, dental hygiene good, tongue normal and double uvula  Neck - bilateral symmetric anterior adenopathy  Chest - clear to auscultation, no wheezes, rales or rhonchi, symmetric air entry, no tachypnea, retractions or cyanosis  Heart - normal rate and regular rhythm, S1 and S2 normal  Extremities - no pedal edema noted  Skin - normal coloration and turgor, no rashes, no suspicious skin lesions noted       Recent Labwork/Imaging:  No results found for this or any previous visit (from the past 336 hour(s)).         Assessment:  1. Sinusitis, unspecified chronicity, unspecified location   amoxicillin-clavulanate (AUGMENTIN) 875-125 mg tablet      2. Bilateral otitis media  amoxicillin-clavulanate (AUGMENTIN) 875-125 mg tablet            Plan:  1.  Likely sinusitis.  Treat with Augmentin twice a day for 10 days given her allergies to azithromycin and Bactrim.  Follow-up with improvement in 7 to 10 days or sooner with any acute changes in symptoms.  Can take Mucinex for additional congestion relief.  2.  She has pretty significant otitis media.  I am almost concern for possible perforation to develop.  Start on Augmentin twice a day for 10 days.  Follow-up if no improvement in 7 to 10 days or sooner with any acute changes in symptoms.    No orders of the defined types were placed in this encounter.        Bonnita Hollow [place], [place] 12/26/2021

## 2022-01-15 ENCOUNTER — Emergency Department: Payer: BLUE CROSS/BLUE SHIELD

## 2022-01-15 ENCOUNTER — Other Ambulatory Visit: Payer: Self-pay

## 2022-01-15 ENCOUNTER — Emergency Department
Admission: EM | Admit: 2022-01-15 | Discharge: 2022-01-15 | Disposition: A | Discharge status: Home / Self Care | Payer: BLUE CROSS/BLUE SHIELD | Source: Ambulatory Visit | Attending: Physician Assistant | Admitting: Physician Assistant

## 2022-01-15 DIAGNOSIS — M7061 Trochanteric bursitis, right hip: Secondary | ICD-10-CM | POA: Insufficient documentation

## 2022-01-15 DIAGNOSIS — Z975 Presence of (intrauterine) contraceptive device: Secondary | ICD-10-CM

## 2022-01-15 DIAGNOSIS — M79651 Pain in right thigh: Secondary | ICD-10-CM

## 2022-01-15 DIAGNOSIS — M25551 Pain in right hip: Secondary | ICD-10-CM

## 2022-01-15 MED ORDER — PREDNISONE 20 MG PO TABS *I*
20.0000 mg | ORAL_TABLET | Freq: Once | ORAL | Status: AC
Start: 2022-01-15 — End: 2022-01-15
  Administered 2022-01-15: 20 mg via ORAL
  Filled 2022-01-15: qty 1

## 2022-01-15 MED ORDER — METHYLPREDNISOLONE 4 MG PO TBPK *A*
ORAL_TABLET | ORAL | 0 refills | Status: DC
Start: 2022-01-15 — End: 2022-08-06

## 2022-01-15 NOTE — ED Provider Notes (Signed)
History     Chief Complaint   Patient presents with   . Leg Pain   . Hip Pain     31 year old female who presented to the emergency room with a past medical history of ADHD anxiety disorder with a complaint of right hip pain.  States that its been bothering her for the past couple of days.  She has been traveling a lot for work and working out as well.  She states the pains in the lateral aspect of the right hip over the greater trochanter and then radiates down into the anterior thigh.  She states with certain movements she does get a sharp pain.  She denies any back pain.            Medical/Surgical/Family History     Past Medical History:   Diagnosis Date   . ADD (attention deficit disorder)     not formerly diagnosed   . Anxiety disorder         Patient Active Problem List   Diagnosis Code   . Breast asymmetry in female N93.89   . Viral URI with cough J06.9   . Anxiety disorder F41.9   . Migraine G43.909            Past Surgical History:   Procedure Laterality Date   . TONSILLECTOMY AND ADENOIDECTOMY       Family History   Problem Relation Age of Onset   . Cancer Maternal Grandfather         Lung cancer, smoker   . Heart failure Maternal Grandfather    . Thyroid disease Maternal Grandfather    . Heart failure Maternal Grandmother    . Diabetes Maternal Grandmother    . Anemia Maternal Grandmother    . COPD Maternal Grandmother    . Osteoporosis Maternal Aunt    . Depression Maternal Aunt    . Thyroid disease Mother    . Diabetes Mother    . COPD Mother    . Rheum arthritis Mother    . Diverticulitis Father           Social History     Tobacco Use   . Smoking status: Never   . Smokeless tobacco: Never   Substance Use Topics   . Alcohol use: Yes     Comment: socially    . Drug use: Not Currently     Types: Marijuana     Comment: occasional     Living Situation     Questions Responses    Patient lives with Spouse    Homeless     Caregiver for other family member     External Services     Employment     Domestic  Violence Risk                 Review of Systems   Review of Systems   Musculoskeletal:        Right hip pain   All other systems reviewed and are negative.      Physical Exam     Triage Vitals  Triage Start: Start, (01/15/22 1355)  First Recorded BP: 137/84, Resp: 16, Temp: 36.6 C (97.9 F), Temp src: TEMPORAL Oxygen Therapy SpO2: 98 %, Oximetry Source: Lt Hand, O2 Device: None (Room air), Heart Rate: 86, (01/15/22 1404) Heart Rate (via Pulse Ox): 86, (01/15/22 1404).  First Pain Reported  0-10 Scale: 6, Pain Location/Orientation: Hip Right, Pain Descriptors: Dull;Aching;Throbbing, (01/15/22 1404)  Physical Exam  Vitals and nursing note reviewed.   Constitutional:       Appearance: Normal appearance.   Musculoskeletal:      Comments: Right hip tender over greater trochanter with increased pain with internal rotation of the right hip   Skin:     General: Skin is warm and dry.   Neurological:      General: No focal deficit present.      Mental Status: She is alert and oriented to person, place, and time.   Psychiatric:         Mood and Affect: Mood normal.         Behavior: Behavior normal.         Medical Decision Making     Assessment:  31 year old female with complaint of right hip pain.  She is tender over the greater trochanter    Differential diagnosis:  Trochanteric bursitis    Plan:  Steroids    X-rays of the right hip and femur.    ED Course and Disposition:  X-rays of right hip and femur are negative.    This is a right-sided trochanteric bursitis we will give her some prednisone and a Medrol Dosepak.  She is to follow-up with primary care doctor or orthopedics.              Deidre Ala, PA             Deidre Ala, [place]  01/15/22 1843

## 2022-01-15 NOTE — ED Triage Notes (Signed)
Pt came into the ER c/o a constant throbbing R Hip and leg pain that started around 2 weeks ago and has been getting worse. Pt thought she pulled a muscle while lifting weights, but even with rest it was getting worse. Pt reports swelling in that area and states that sometimes it feels hot to the touch.       Prehospital medications given: No

## 2022-01-15 NOTE — Discharge Instructions (Signed)
Return if worse   Follow up with PMD   Start Medrol Dos pak tomorrow   Tylenol and motrin as needed   Moist Heat Right Hip

## 2022-01-21 ENCOUNTER — Encounter: Payer: Self-pay | Admitting: Primary Care

## 2022-01-27 ENCOUNTER — Ambulatory Visit: Payer: BLUE CROSS/BLUE SHIELD | Attending: Orthopedic Surgery | Admitting: Orthopedic Surgery

## 2022-01-27 ENCOUNTER — Other Ambulatory Visit: Payer: Self-pay

## 2022-01-27 VITALS — BP 126/82 | HR 78 | Temp 97.1°F | Resp 18 | Wt 247.0 lb

## 2022-01-27 DIAGNOSIS — M25551 Pain in right hip: Secondary | ICD-10-CM | POA: Insufficient documentation

## 2022-01-27 DIAGNOSIS — S73191A Other sprain of right hip, initial encounter: Secondary | ICD-10-CM | POA: Insufficient documentation

## 2022-01-27 MED ORDER — MELOXICAM 15 MG PO TABS *I*
15.0000 mg | ORAL_TABLET | Freq: Every day | ORAL | 1 refills | Status: AC
Start: 2022-01-27 — End: 2022-02-26

## 2022-01-27 NOTE — Progress Notes (Signed)
History:    Amanda Phelps is a 31 y.o. female who present to the office today for evaluation of right-sided hip pain.  This has been ongoing for about 3 weeks.  Notes that in November she started to increase her physical activity including cycling a lot and weightlifting.  Notes initially pain was to the lateral aspect of the hip however notes it is now going into her right groin.  This does come worse with increased activity.  Notes she is taking intermittent anti-inflammatories only when it gets bad.  Hip flexion type activities make it worse.    Past medical, surgical, social, and family histories were updated and reviewed.   Medications and drug allergies also updated and reviewed.    ROS:  Fourteen system review is updated and reviewed. There are no significant changes.    Physical exam    Vitals Obtained today were reviewed    GENERAL  On examination, the patient is well-developed, well-nourished and in no apparent distress.      PSYCH  The patient's mood and affect are appropriate.  They answer questions appropriately today on examination.    SKIN  The are no abrasions on the leg, no previous scars, no rash    MOTOR EXAM  There is full strength of the lower extremity including leg flexion and leg extension.    SENSORY EXAM  Sensation is intact in the lower extremity    VASCULAR  Less than two second cap refill     SPECIFIC HIP EXAM     There is no  swelling.  There is anterior and trochanteric tenderness to palpation.  FADIR is positive for pain.  FABER is positive for pain.  She has hip flexion to 120.  With the hip flexed to 90 degrees, She has internal rotation to 30, and external rotation to 30. negative grind test.    Diagnostic imaging  Personally viewed interpreted by myself today are outside x-rays of the patient's pelvis and right hip.  There is no evidence of fractures or dislocations.  She has maintained joint space.      IMPRESSION    ICD-10-CM ICD-9-CM   1. Tear of right acetabular labrum,  initial encounter  S73.191A 843.8   2. Right hip pain  M25.551 719.45       PLAN  Reviewed imaging and discussed clinical diagnosis of likely labral tear about her right hip versus trochanteric bursitis.  At this point would recommend initiation of conservative measures including anti-inflammatory medications and formal physical therapy.  Discussed activity modifications.  Risk benefits side effects of medication were specifically discussed with the patient.  Instructed once per day with food and not take with any other NSAID.  We will follow-up in 4 to 6 weeks.  Discussed if she has worsening pain or pain with ambulation recommend sooner follow-up.      Kristeen Miss, DO,DPT,MS  Orthopaedic Sports Medicine

## 2022-02-07 IMAGING — CR DG KNEE COMPLETE 4+V*L*
5 series · 5 of 5 positions shown · non-contrast
Comparison: None.

CLINICAL DATA: 29-year-old female with leg pain, knee pain since
yesterday. Recent long drive.

EXAM:
LEFT KNEE - COMPLETE 4+ VIEW

[t knee ap left]
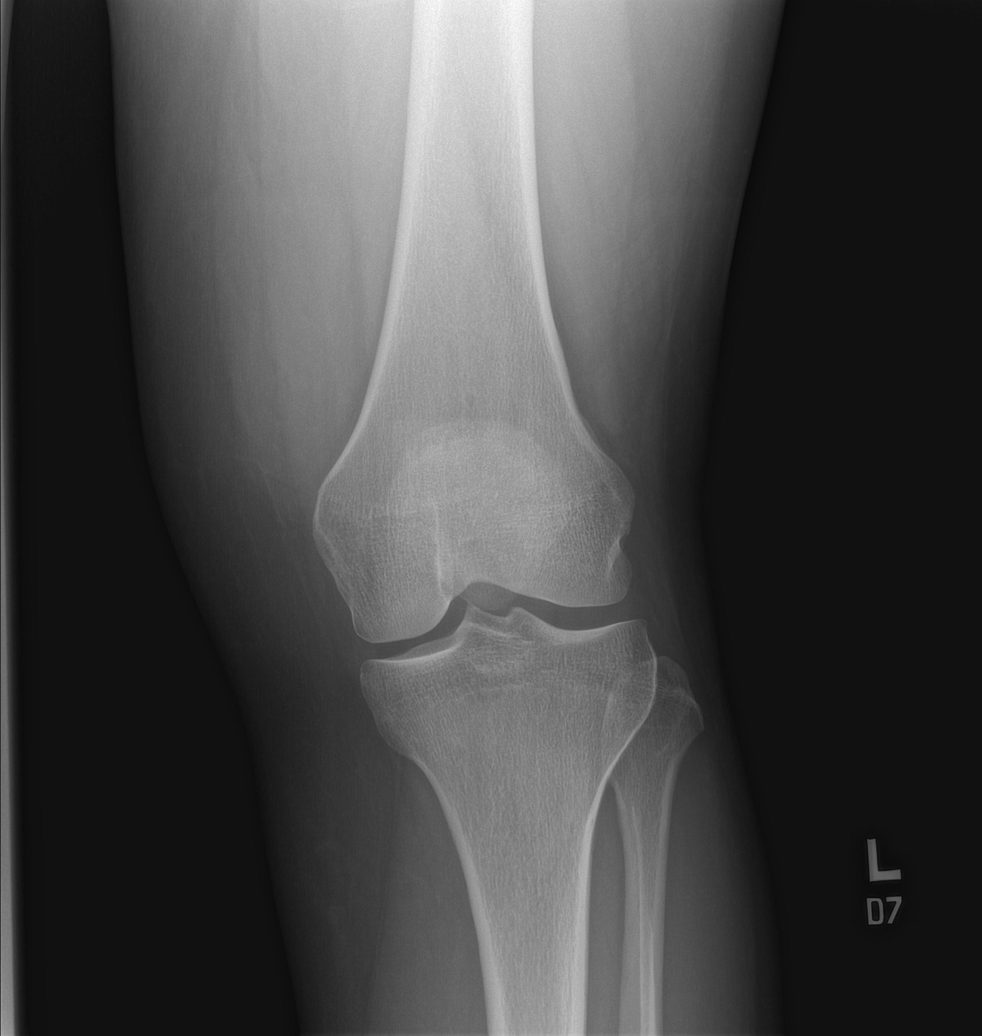

[t knee obl left (1 of 2)]
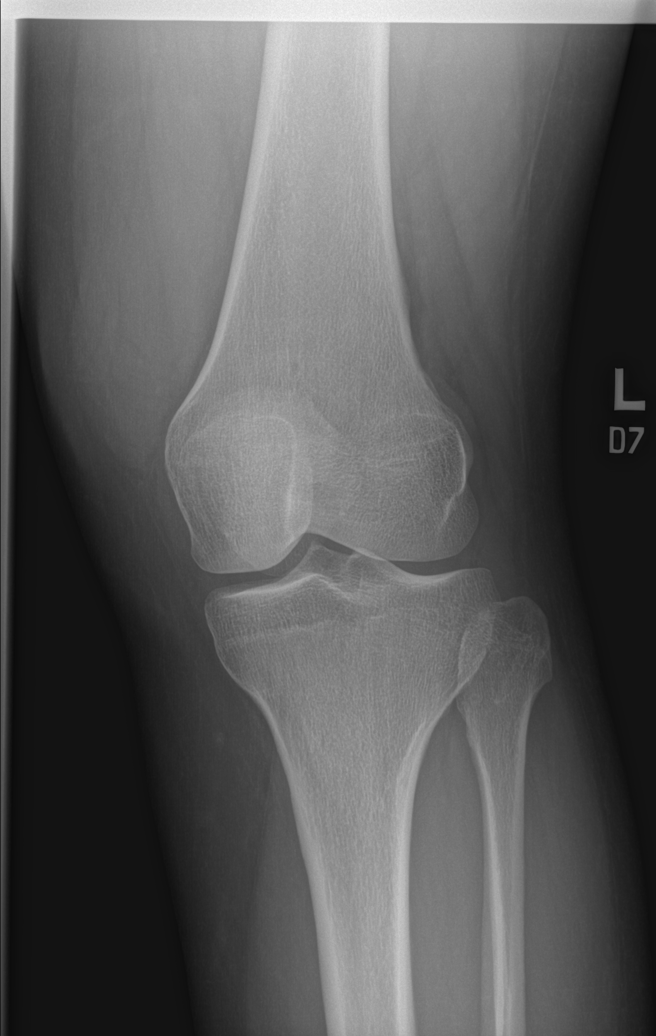

[t knee obl left (2 of 2)]
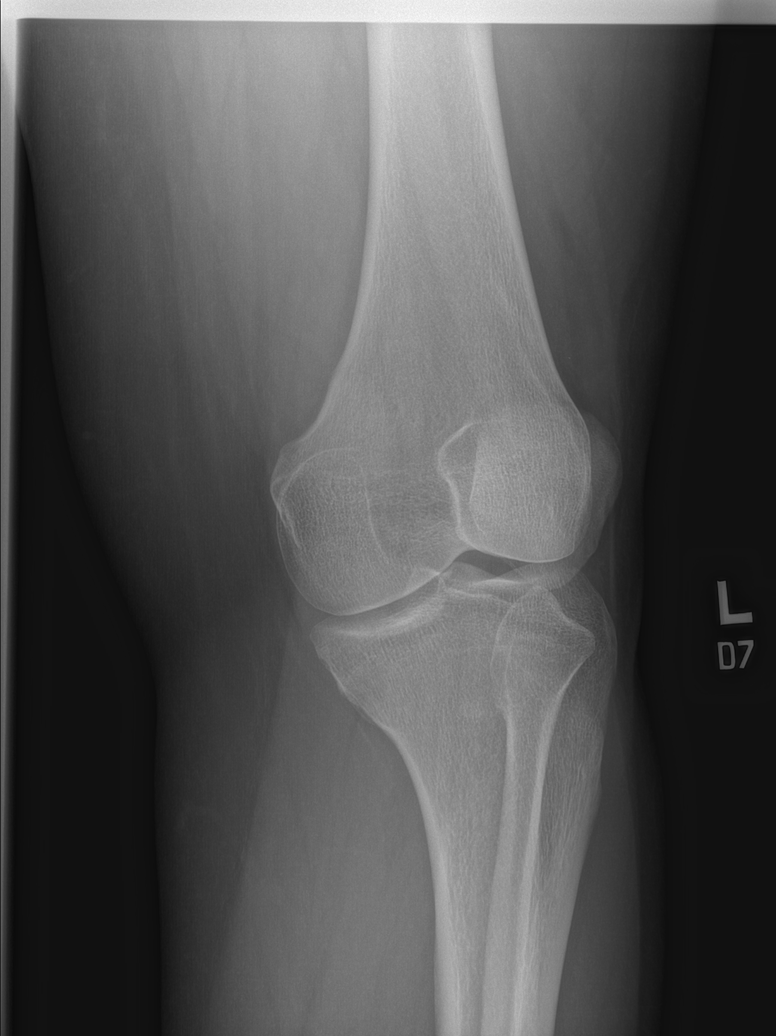

[t knee lat left (1 of 2)]
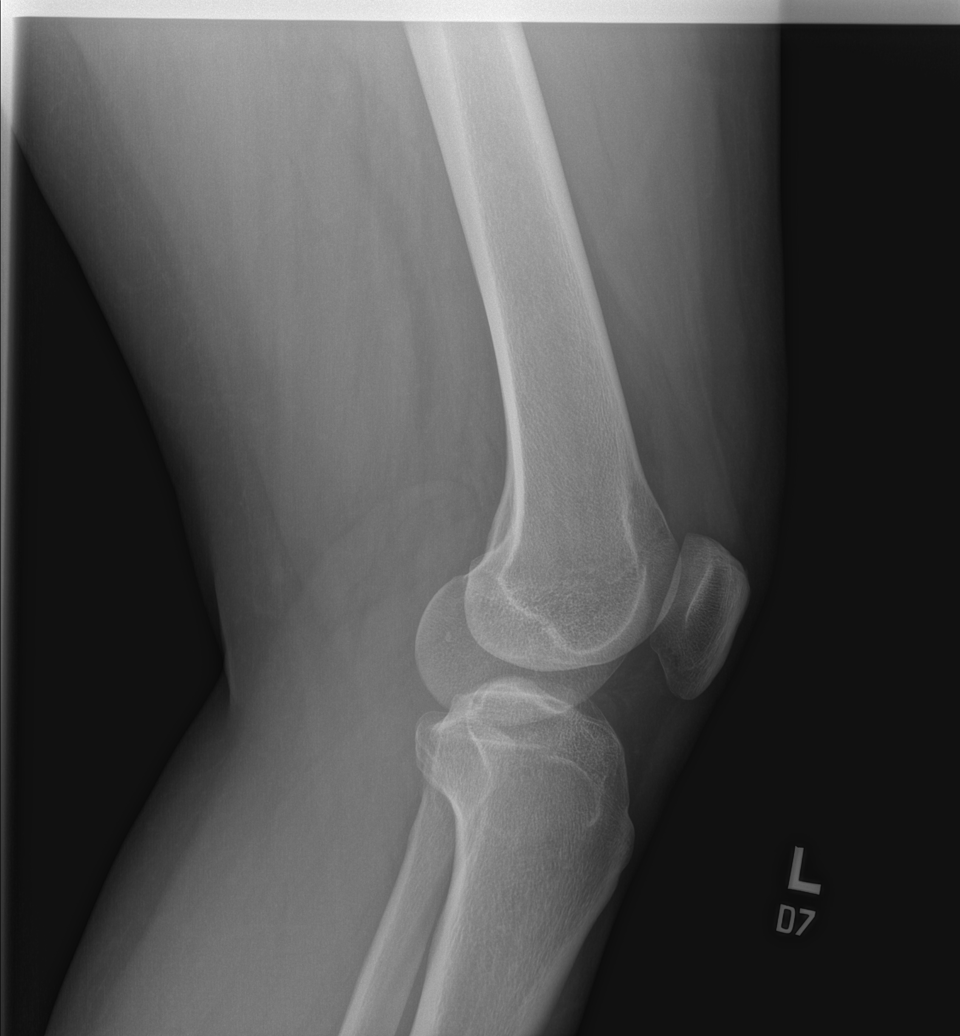

[t knee lat left (2 of 2)]
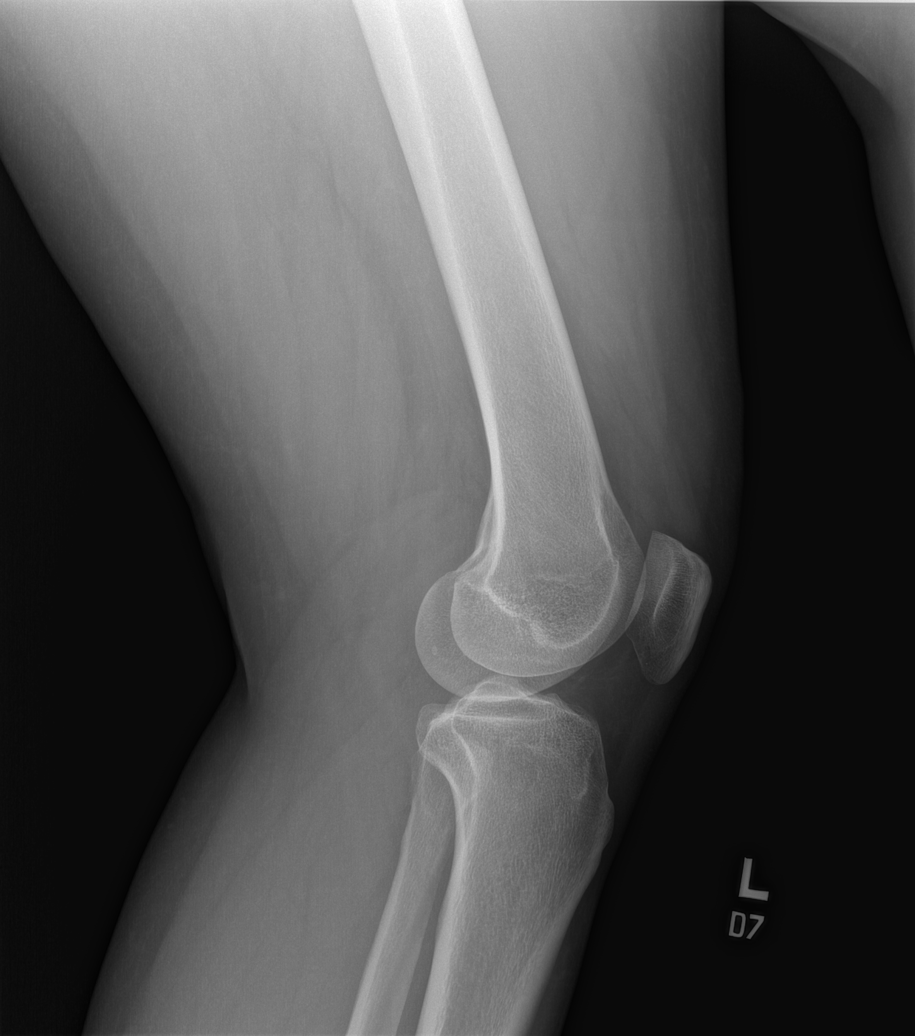

[5 of 5 positions shown; findings below may reference images not displayed]

FINDINGS: Bone mineralization is within normal limits. No evidence of
fracture, dislocation, or joint effusion. No evidence of arthropathy
or other focal bone abnormality. Soft tissues are unremarkable.
IMPRESSION: Negative.

## 2022-02-11 ENCOUNTER — Telehealth: Payer: Self-pay

## 2022-02-11 NOTE — Telephone Encounter (Signed)
Patient NC/NS at [facility] for evaluation on February 05, 2022. So, she has not been scheduled.

## 2022-02-26 ENCOUNTER — Encounter: Payer: Self-pay | Admitting: Gastroenterology

## 2022-03-04 ENCOUNTER — Ambulatory Visit: Payer: BLUE CROSS/BLUE SHIELD | Admitting: Orthopedic Surgery

## 2022-03-09 ENCOUNTER — Telehealth: Payer: Self-pay | Admitting: Primary Care

## 2022-03-09 NOTE — Telephone Encounter (Signed)
Skin checks,mole checks,spot checks, skin lesions,atypical moles, rashes   and/or acne we're only accepting e-consults for at this time . Please send   a e-consult over for this patient to be evaluated.          Referral Coordinator   [facility]

## 2022-03-10 NOTE — Telephone Encounter (Signed)
I have sent the referral back to Dermatology

## 2022-03-11 ENCOUNTER — Other Ambulatory Visit: Payer: Self-pay | Admitting: Primary Care

## 2022-03-11 DIAGNOSIS — E039 Hypothyroidism, unspecified: Secondary | ICD-10-CM

## 2022-04-15 ENCOUNTER — Emergency Department
Admission: EM | Admit: 2022-04-15 | Discharge: 2022-04-15 | Disposition: A | Discharge status: Home / Self Care | Payer: BLUE CROSS/BLUE SHIELD | Source: Ambulatory Visit | Attending: Student in an Organized Health Care Education/Training Program | Admitting: Student in an Organized Health Care Education/Training Program

## 2022-04-15 ENCOUNTER — Other Ambulatory Visit: Payer: Self-pay

## 2022-04-15 ENCOUNTER — Emergency Department: Payer: BLUE CROSS/BLUE SHIELD

## 2022-04-15 DIAGNOSIS — Y9289 Other specified places as the place of occurrence of the external cause: Secondary | ICD-10-CM | POA: Insufficient documentation

## 2022-04-15 DIAGNOSIS — Y761 Therapeutic (nonsurgical) and rehabilitative obstetric and gynecological devices associated with adverse incidents: Secondary | ICD-10-CM | POA: Insufficient documentation

## 2022-04-15 DIAGNOSIS — Z113 Encounter for screening for infections with a predominantly sexual mode of transmission: Secondary | ICD-10-CM | POA: Insufficient documentation

## 2022-04-15 DIAGNOSIS — T8332XA Displacement of intrauterine contraceptive device, initial encounter: Secondary | ICD-10-CM | POA: Insufficient documentation

## 2022-04-15 HISTORY — DX: Autoimmune thyroiditis: E06.3

## 2022-04-15 LAB — URINE MICROSCOPIC (IQ200)

## 2022-04-15 LAB — PREGNANCY, URINE: Preg Test,UR: NEGATIVE

## 2022-04-15 LAB — CBC AND DIFFERENTIAL
Baso # K/uL: 0 10*3/uL (ref 0.0–0.2)
Basophil %: 0.4 %
Eos # K/uL: 0.1 10*3/uL (ref 0.0–0.5)
Eosinophil %: 1.1 %
Hematocrit: 39 % (ref 34–49)
Hemoglobin: 13.3 g/dL (ref 11.2–16.0)
IMM Granulocytes #: 0 10*3/uL (ref 0.0–0.0)
IMM Granulocytes: 0.1 %
Lymph # K/uL: 2 10*3/uL (ref 1.0–5.0)
Lymphocyte %: 25.1 %
MCH: 31 pg (ref 26–32)
MCHC: 35 g/dL (ref 32–36)
MCV: 89 fL (ref 75–100)
Mono # K/uL: 0.7 10*3/uL (ref 0.1–1.0)
Monocyte %: 8.8 %
Neut # K/uL: 5.1 10*3/uL (ref 1.5–6.5)
Nucl RBC # K/uL: 0 10*3/uL (ref 0.0–0.0)
Nucl RBC %: 0 /100 WBC (ref 0.0–0.2)
Platelets: 209 10*3/uL (ref 150–450)
RBC: 4.3 MIL/uL (ref 4.0–5.5)
RDW: 12.6 % (ref 0.0–15.0)
Seg Neut %: 64.5 %
WBC: 7.9 10*3/uL (ref 3.5–11.0)

## 2022-04-15 LAB — COMPREHENSIVE METABOLIC PANEL
ALT: 13 U/L (ref 0–35)
AST: 17 U/L (ref 0–35)
Albumin: 4.5 g/dL (ref 3.5–5.2)
Alk Phos: 54 U/L (ref 35–105)
Anion Gap: 12 (ref 7–16)
Bilirubin,Total: 0.4 mg/dL (ref 0.0–1.2)
CO2: 24 mmol/L (ref 20–28)
Calcium: 9.2 mg/dL (ref 8.8–10.2)
Chloride: 103 mmol/L (ref 96–108)
Creatinine: 0.7 mg/dL (ref 0.51–0.95)
Glucose: 85 mg/dL (ref 60–99)
Lab: 11 mg/dL (ref 6–20)
Potassium: 3.6 mmol/L (ref 3.3–4.6)
Sodium: 139 mmol/L (ref 133–145)
Total Protein: 7.1 g/dL (ref 6.3–7.7)
eGFR BY CREAT: 120 *

## 2022-04-15 LAB — URINALYSIS REFLEX TO CULTURE
Glucose,UA: NEGATIVE
Ketones, UA: NEGATIVE
Nitrite,UA: NEGATIVE
Protein,UA: NEGATIVE
Specific Gravity,UA: 1.02 (ref 1.002–1.030)
pH,UA: 6 (ref 5.0–8.0)

## 2022-04-15 MED ORDER — ACETAMINOPHEN 325 MG PO TABS *I*
650.0000 mg | ORAL_TABLET | Freq: Four times a day (QID) | ORAL | 0 refills | Status: AC | PRN
Start: 2022-04-15 — End: 2023-04-15

## 2022-04-15 MED ORDER — ACETAMINOPHEN 500 MG PO TABS *I*
1000.0000 mg | ORAL_TABLET | Freq: Once | ORAL | Status: AC
Start: 2022-04-15 — End: 2022-04-15
  Administered 2022-04-15: 1000 mg via ORAL
  Filled 2022-04-15: qty 2

## 2022-04-15 MED ORDER — IBUPROFEN 600 MG PO TABS *I*
600.0000 mg | ORAL_TABLET | Freq: Three times a day (TID) | ORAL | 0 refills | Status: DC | PRN
Start: 2022-04-15 — End: 2022-07-23

## 2022-04-15 MED ORDER — KETOROLAC TROMETHAMINE 15 MG/ML IJ SOLN *I*
15.0000 mg | Freq: Once | INTRAMUSCULAR | Status: AC
Start: 2022-04-15 — End: 2022-04-15
  Administered 2022-04-15: 15 mg via INTRAVENOUS
  Filled 2022-04-15: qty 1

## 2022-04-15 MED ORDER — MORPHINE SULFATE 4 MG/ML IV SOLN *WRAPPED*
4.0000 mg | Freq: Once | INTRAVENOUS | Status: AC
Start: 2022-04-15 — End: 2022-04-15
  Administered 2022-04-15: 4 mg via INTRAVENOUS
  Filled 2022-04-15: qty 1

## 2022-04-15 MED ORDER — MORPHINE SULFATE 15 MG PO TABS *I*
15.0000 mg | ORAL_TABLET | ORAL | 0 refills | Status: AC | PRN
Start: 2022-04-15 — End: 2022-04-17

## 2022-04-15 NOTE — ED Triage Notes (Signed)
Patient came to ED stating about a week ago it felt like her IUD displaced. Patient stated she had her OBGYN appointment at the end of September so she was just going to wait it out. Patient states yesterday pain started to get worse and feels like its throbbing in her abdomen. Patient is concerned with her IUD being dislodged. Patient reports when she sits it feels like she is being stabbed. Patient does report having diarrhea for the last couple days. Patient is nauseous but denies emesis.

## 2022-04-15 NOTE — Discharge Instructions (Signed)
Amanda Phelps, your ED visit today was for abdominal pain.     From our interview, physical exam, and diagnostic testing, we suspect that your symptoms are associated with slightly malpositioned IUD. Please talk to your OB/GYN provider.       For your complaint, we sent prescriptions for pain medications to your pharmacy on file to help, please take as directed.    Please continue taking your home medications as prescribed.    We recommend that you read and follow the attached information to help with your home management of this issue.    Amanda Phelps, Please call and follow-up with your primary care doctor/regular provider as soon as possible to ensure your symptoms are closely followed and under control.     If you start to develop any concerning symptoms such as fevers or unrelenting pain despite medications, please return to the ED to be re-evaluated.

## 2022-04-15 NOTE — ED Provider Notes (Signed)
History     Chief Complaint   Patient presents with    Abdominal Pain       History provided by:  Patient and medical records  Language interpreter used: No      Amanda Phelps is a 31 y.o. female who presents with the above chief complaint.  Patient reports her symptoms started 1 week prior with sharp back discomfort that radiated to her anterior belly.  She states that she was concerned about her IUD being misplaced.  She states she still feels her strings though reports that she feels the strings are slightly longer than normal.  Denies any fevers.  Denies any vaginal bleeding or vaginal discharge.  Denies any dysuria, increased urinary frequency, or hematuria.  States she had her IUD placed many years prior.  Endorses nausea though denies any vomiting.  Denies any rectal bleeding.      Medical/Surgical/Family History     Past Medical History:   Diagnosis Date    ADD (attention deficit disorder)     not formerly diagnosed    Anxiety disorder     Hashimoto's disease         Patient Active Problem List   Diagnosis Code    Breast asymmetry in female N46.89    Viral URI with cough J06.9    Anxiety disorder F41.9    Migraine G43.909            Past Surgical History:   Procedure Laterality Date    TONSILLECTOMY AND ADENOIDECTOMY            Social History     Tobacco Use    Smoking status: Never    Smokeless tobacco: Never   Substance Use Topics    Alcohol use: Yes     Comment: socially     Drug use: Not Currently     Types: Marijuana     Comment: occasional             Review of Systems    Physical Exam     Triage Vitals  Triage Start: Start, (04/15/22 1950)  First Recorded BP: 122/80, Resp: 16, Temp: 35.8 C (96.4 F), Temp src: TEMPORAL Oxygen Therapy SpO2: 99 %, Oximetry Source: Lt Hand, O2 Device: None (Room air), Heart Rate: 66, (04/15/22 1949) Heart Rate (via Pulse Ox): 66, (04/15/22 1949).  First Pain Reported  0-10 Scale: 6, Pain Location/Orientation: Abdomen;Groin, (04/15/22 1949)       Physical  Exam  Vitals and nursing note reviewed.   Constitutional:       General: She is in acute distress.      Appearance: Normal appearance.   HENT:      Head: Normocephalic and atraumatic.      Nose: No congestion or rhinorrhea.      Mouth/Throat:      Mouth: Mucous membranes are moist.      Pharynx: Oropharynx is clear. No oropharyngeal exudate or posterior oropharyngeal erythema.   Eyes:      General:         Right eye: No discharge.         Left eye: No discharge.      Extraocular Movements: Extraocular movements intact.      Pupils: Pupils are equal, round, and reactive to light.   Cardiovascular:      Rate and Rhythm: Normal rate and regular rhythm.      Pulses: Normal pulses.   Pulmonary:      Effort: Pulmonary effort  is normal. No respiratory distress.      Breath sounds: Normal breath sounds. No wheezing or rales.   Abdominal:      General: Bowel sounds are normal. There is no distension.      Palpations: Abdomen is soft.      Tenderness: There is abdominal tenderness. There is no right CVA tenderness, left CVA tenderness, guarding or rebound.      Comments: Lower abdominal tenderness on palpation bilaterally.  No right upper quadrant tenderness.  No right lower quadrant tenderness.  Negative Murphy sign.  Negative Rovsing and psoas signs.   Genitourinary:     Comments: Deferred pelvic exam.  Musculoskeletal:         General: No tenderness. Normal range of motion.      Cervical back: Normal range of motion and neck supple.   Skin:     General: Skin is warm and dry.      Capillary Refill: Capillary refill takes less than 2 seconds.   Neurological:      General: No focal deficit present.      Mental Status: She is alert and oriented to person, place, and time.   Psychiatric:         Mood and Affect: Mood normal.         Behavior: Behavior normal.         Medical Decision Making   Patient seen by me on:  04/15/2022    Assessment:  ILLANA Phelps is a 31 y.o. female who presents with 1 week of persistent sharp  pelvic discomfort, no infectious symptoms, vital signs stable, physical exam as above, concerning for malpositioned IUD.  Could be acute cystitis or acute appendectomy though she is well-appearing otherwise.  Will obtain pelvic ultrasound, urinalysis, STI/STD screening, and metabolic work-up.    Differential diagnosis:  Malpositioned IUD  Acute cystitis  Fibroma  Ovarian cyst  Ovarian torsion  STI/STD  Electrolyte disturbance  Dehydration  Acute kidney injury  Anemia  Occult Infection      Plan:  Orders Placed This Encounter      Chlamydia NAAT (PCR)      N. gonorrhoeae NAAT (PCR)      Vaginitis screen: DNA probe      Urinalysis reflex to culture      US pelvic complete with transvaginal complete      Pregnancy, urine      CBC and differential      Comprehensive metabolic panel      Urinalysis with reflex to Microscopic UA and reflex to Bacterial Culture      Urine microscopic (iq200)      acetaminophen (TYLENOL) tablet 1,000 mg      ketorolac (TORADOL) 15 mg/mL injection 15 mg      morphine injection 4 mg      morphine 15 mg immediate release tablet      acetaminophen (TYLENOL) 325 mg tablet      ibuprofen (ADVIL,MOTRIN) 600 mg tablet        Consideration of hospitalization: Pending work-up as above.    ED Course and Disposition:  .         ED Course as of 04/16/22 0303   Wed Apr 15, 2022   2043 BP: 122/80   2043 Temp: 35.8 C (96.4 F)   2043 Temp src: TEMPORAL   2043 Heart Rate: 66   2043 Resp: 16   2043 SpO2: 99 %   2043 O2 Device: None (Room air)   2043  Weight: 109.8 kg (242 lb)   2230 All blood work overall reassuring.  Urinalysis negative.  STI screening pending.   2230 US pelvic complete with transvaginal complete  Low-lying IUD, with one of the arms penetrating the myometrium, with similar configuration compared to CT dated 09/02/2021.   2233 Safe to follow-up with her OB/GYN for potential removal and replacement.  She is understanding of this and agreeable.  Prescription for short course of pain medicine  sent to pharmacy on file.  Serial vital signs reassuring.  Work note given at discharge.  Return precaution given.       Wolfgang Phoenix, MD          Author:  Wolfgang Phoenix, MD         Wolfgang Phoenix, MD  04/16/22 [phone]

## 2022-04-15 NOTE — ED Notes (Signed)
Patient verbalizes understanding of discharge instructions at this time. Patient does have a ride home with husband.

## 2022-04-16 LAB — CHLAMYDIA NAAT (PCR): Chlamydia NAAT (PCR): NEGATIVE

## 2022-04-16 LAB — VAGINITIS SCREEN: DNA PROBE

## 2022-04-16 LAB — N. GONORRHOEAE NAAT (PCR): N. gonorrhoeae NAAT (PCR): NEGATIVE

## 2022-04-17 ENCOUNTER — Other Ambulatory Visit: Payer: Self-pay

## 2022-04-17 ENCOUNTER — Ambulatory Visit: Payer: BLUE CROSS/BLUE SHIELD

## 2022-04-17 VITALS — BP 118/72 | Wt 248.0 lb

## 2022-04-17 DIAGNOSIS — R102 Pelvic and perineal pain: Secondary | ICD-10-CM | POA: Insufficient documentation

## 2022-04-17 DIAGNOSIS — T8332XA Displacement of intrauterine contraceptive device, initial encounter: Secondary | ICD-10-CM | POA: Insufficient documentation

## 2022-04-17 DIAGNOSIS — Z30433 Encounter for removal and reinsertion of intrauterine contraceptive device: Secondary | ICD-10-CM | POA: Insufficient documentation

## 2022-04-17 DIAGNOSIS — Z01419 Encounter for gynecological examination (general) (routine) without abnormal findings: Secondary | ICD-10-CM | POA: Insufficient documentation

## 2022-04-17 LAB — POCT URINE PREGNANCY
Exp date: 41324
Lot #: 159134
Preg Test,UR POC: NEGATIVE

## 2022-04-17 NOTE — Progress Notes (Signed)
Subjective:     Amanda Phelps is a 31 y.o.  y.o.female who presents for an annual exam. The patient reports pelvic pain for approx 10 days.      ED visit [place] on 04/15/22 noted IUD was malpositioned in cervix.    GYN History    Cycles are irregular without significant dysmenorrhea.    Last pap smear: Date: 9/22 Results: no abnormalities  History of abnormal pap smear: No.   The patient is sexually active.     STD History: none  Current contraception: IUD: Mirena    Patient's medications, allergies, past medical, surgical, social and family histories were reviewed and updated as appropriate.       Objective:      BP 118/72   Wt 112.5 kg (248 lb)   LMP  (LMP Unknown)   BMI 35.58 kg/m   General appearance: alert, appears stated age, and no distress  Neck: supple, symmetrical, trachea midline  Back: no tenderness to percussion or palpation  Lungs: clear to auscultation bilaterally  Breasts: not assessed today  Heart: regular rate and rhythm  Abdomen: soft, tender on palpation  Pelvic: cervix normal in appearance, external genitalia normal, no adnexal masses or tenderness, no cervical motion tenderness, uterus normal size, shape, and consistency, and vagina normal without discharge.  IUD strings visualized.  Pap obtained. See Proc Doc for IUD removal and reinsertion.  Extremities: extremities normal, atraumatic, no cyanosis or edema  Skin: Skin color, texture, turgor normal. No rashes or lesions      Assessment:      Presents for an annual exam. She has the following concerns: 1) pelvic pain     Plan:      Reviewed ASCCP guidelines.  Pap was performed today and pending.    Cultures obtained and sent.    She would like Mirena IUD replaced today.  PAINS warning danger s/s to report discussed with verbalized understanding.   Follow up 4 weeks for string check.    Follow up with PCP for primary care concerns.  Follow up annually and as needed for gyn concerns.    Loreli Slot CNM

## 2022-04-17 NOTE — Procedures (Unsigned)
IUD remove/insert  Date/Time: 04/17/2022  9:30 AM EDT    Performed by: Loreli Slot, CNM    Amanda Phelps is a 31 y.o. G1P1001 who requests removal of an indwelling IUD and insertion of a new IUD.  Urine pregnancy test 04/17/2022: Negative    The current device is being removed due to the device being malpositioned.     Consent:   Consent obtained:  Verbal and written  Consent given by:  Patient  The procedure risks, benefits, complications and possible alternatives were discussed with the patient.  All questions were answered prior to the patient giving informed consent.    Pre-procedure:   Time Out: The time out was performed immediately prior to procedure, the correct patient, procedure, and site were verified.  Chaperone present: Yes    Chaperone Name: Cleotilde Neer, RN    Procedure Details:  The patient was placed in dorsal lithotomy position. Bimanual exam showed the uterus to be straight. A speculum was inserted into the vagina and IUD strings were visualized. A/An Tresa Endo clamp was used to secure the strings. The IUD was removed and noted to be intact on inspection. Bleeding was noted.   A specimen was obtained for sexually transmitted infection testing. The cervix was prepped with Iodine. A single tooth tenaculum was placed on the anterior lip of the cervix. The uterus was sounded to 7 cm. Using the applicator, the IUD was successfully placed and floated to the fundus without difficulty. The string was cut to 2-3 cm. The single tooth tenaculum was removed without incident. The patient tolerated the procedure well.     Bleeding was minimal. Estimated blood loss was <5cc.   Assessment:    IUD removal at patient request and insertion of new 1 each levonorgestrel (MIRENA) 20 MCG/DAY IUD.       Plan:    Post insertion instructions were reviewed with the patient and written post insertion handout was also provided.   All questions were answered and the patient stated a good understanding of the  instructions.   Patient will monitor for heavy bleeding, fever, expulsion or severe pain.   Patient with follow-up in  4  weeks for IUD string check.Marland Kitchen

## 2022-04-17 NOTE — Progress Notes (Deleted)
[facility]    Chief Complaint:     No chief complaint on file.      Subjective:     Amanda Phelps is a 31 y.o. female G1P1001 with LMP of   who presents to the office with complaints of ***    Other issues today: {symptoms:30830}.    Review of Systems  All other ROS negative unless otherwise noted in HPI.      Objective:     There were no vitals taken for this visit.     OB/GYN Physical Exam   {Wet Prep Results (Optional):[phone]::"Wet Prep: "}    Assessment:     ***     Plan:      ***    Loreli Slot, CNM 04/17/2022 8:59 AM

## 2022-04-18 MED ORDER — LEVONORGESTREL (MIRENA) 20 MCG/DAY IU IUD *I*
1.0000 | INTRAUTERINE_SYSTEM | Freq: Once | INTRAUTERINE | Status: AC | PRN
Start: 2022-04-17 — End: 2022-04-17
  Administered 2022-04-17: 1 via INTRAUTERINE

## 2022-04-20 ENCOUNTER — Encounter: Payer: Self-pay | Admitting: Primary Care

## 2022-04-20 LAB — N. GONORRHOEAE NAAT (PCR): N. gonorrhoeae NAAT (PCR): NEGATIVE

## 2022-04-20 LAB — CHLAMYDIA NAAT (PCR): Chlamydia NAAT (PCR): NEGATIVE

## 2022-04-20 LAB — TRICHOMONAS NAAT (PCR): Trichomonas NAAT (PCR): NEGATIVE

## 2022-04-27 ENCOUNTER — Ambulatory Visit: Payer: BLUE CROSS/BLUE SHIELD | Admitting: Primary Care

## 2022-04-28 LAB — GYN CYTOLOGY

## 2022-04-29 ENCOUNTER — Ambulatory Visit: Payer: PRIVATE HEALTH INSURANCE

## 2022-05-20 ENCOUNTER — Ambulatory Visit: Payer: BLUE CROSS/BLUE SHIELD

## 2022-05-26 ENCOUNTER — Ambulatory Visit: Payer: BLUE CROSS/BLUE SHIELD | Admitting: Dermatology

## 2022-05-26 ENCOUNTER — Other Ambulatory Visit: Payer: Self-pay

## 2022-05-26 DIAGNOSIS — L910 Hypertrophic scar: Secondary | ICD-10-CM

## 2022-05-26 DIAGNOSIS — D229 Melanocytic nevi, unspecified: Secondary | ICD-10-CM

## 2022-05-26 DIAGNOSIS — K130 Diseases of lips: Secondary | ICD-10-CM

## 2022-05-26 NOTE — Progress Notes (Signed)
Dermatology Progress Note    Chief Complaint   Patient presents with    New Patient Visit     Concerning lesion     Derm History and Relevant Medical History: none  ?  HPI:   Amanda Phelps is a pleasant 31 y.o. female here for the concern noted below.  ?  Last visit: new patient    Concern: Here for a concerning lesion on her lip which is sore and grows larger at times. Also has a spot on her back where a mole was taken off once.   ?  Physical Exam:   Skin: All of the following were examined, and were within normal limits, except as noted: Face, Ears, Scalp/Hair, Eyes/Eyelids, Lips, Neck, Chest, Back, Abdomen, and Extremities (RUE/LUE)  --she has a purple soft papule on her left [place] lip. Ill defined, 8-41mm  --she has multiple small dark brown macules on her trunk and arms  --she has a blanchable scar on her left mid thoracic back  ??  Assessment/Plan:    Multiple Benign Nevi  -Advise sun protection with a broad-brimmed hat, long-sleeved shirt, long pants, and 30+ SPF sunscreen reapplied every 2 hours.   -Recommend monthly self skin exam looking for new moles or the following changes in existing moles:  A: asymmetry  B: border smudging or irregularity  C: color (>1 color)  D: diameter greater than a pencil eraser size   E: evolving, or changing mole     Lesion on lip - likely mucocele  -benign and no treatment needed  -will refer to ENT to discuss removal    Hypertrophic scar - back  -benign and no treatment needed    Return to clinic: 2 years    "I Marlowe Alt, LPN, am scribing for and in the presence of Garrison Columbus MD"  05/26/2022 1:50 PM

## 2022-05-26 NOTE — Patient Instructions (Addendum)
Multiple Benign Nevi  -Advise sun protection with a broad-brimmed hat, long-sleeved shirt, long pants, and 30+ SPF sunscreen reapplied every 2 hours.   -Recommend monthly self skin exam looking for new moles or the following changes in existing moles:  A: asymmetry  B: border smudging or irregularity  C: color (>1 color)  D: diameter greater than a pencil eraser size   E: evolving, or changing mole     Lesion on lip - likely mucocele  -benign and no treatment needed  -will refer to ENT to discuss removal    Hypertrophic scar - back  -benign and no treatment needed

## 2022-06-01 ENCOUNTER — Telehealth: Payer: Self-pay | Admitting: Primary Care

## 2022-06-01 MED ORDER — VALACYCLOVIR HCL 1000 MG PO TABS *I*
1000.0000 mg | ORAL_TABLET | Freq: Two times a day (BID) | ORAL | 0 refills | Status: DC
Start: 2022-06-01 — End: 2023-12-06

## 2022-06-01 NOTE — Telephone Encounter (Signed)
Called patient, no answer. Mailbox is full

## 2022-06-01 NOTE — Telephone Encounter (Signed)
Patient has been taking this as needed for when she gets a cold sore. It was last filled by her old PCP. Patient stated that she has a bad cold sore

## 2022-06-01 NOTE — Telephone Encounter (Signed)
Left message for patient to call me back at my extension. Need to know what she is taking this for since it does not look like Amanda Phelps has ever prescribed this for patient before

## 2022-06-01 NOTE — Telephone Encounter (Signed)
valACYclovir (VALTREX)      Pharmacy is calling  requesting a refill on this medication please send to [facility] in [place]

## 2022-07-23 ENCOUNTER — Emergency Department
Admission: EM | Admit: 2022-07-23 | Discharge: 2022-07-23 | Disposition: A | Discharge status: Home / Self Care | Payer: BLUE CROSS/BLUE SHIELD | Source: Ambulatory Visit | Attending: Family Medicine | Admitting: Family Medicine

## 2022-07-23 ENCOUNTER — Other Ambulatory Visit: Payer: Self-pay

## 2022-07-23 ENCOUNTER — Ambulatory Visit: Payer: BLUE CROSS/BLUE SHIELD | Attending: Primary Care | Admitting: Primary Care

## 2022-07-23 ENCOUNTER — Encounter: Payer: Self-pay | Admitting: Primary Care

## 2022-07-23 ENCOUNTER — Emergency Department: Payer: BLUE CROSS/BLUE SHIELD

## 2022-07-23 VITALS — BP 110/74 | HR 79 | Temp 97.7°F | Wt 255.0 lb

## 2022-07-23 DIAGNOSIS — R220 Localized swelling, mass and lump, head: Secondary | ICD-10-CM

## 2022-07-23 DIAGNOSIS — S0093XA Contusion of unspecified part of head, initial encounter: Secondary | ICD-10-CM | POA: Insufficient documentation

## 2022-07-23 DIAGNOSIS — Y998 Other external cause status: Secondary | ICD-10-CM | POA: Insufficient documentation

## 2022-07-23 DIAGNOSIS — F10920 Alcohol use, unspecified with intoxication, uncomplicated: Secondary | ICD-10-CM

## 2022-07-23 DIAGNOSIS — W1830XA Fall on same level, unspecified, initial encounter: Secondary | ICD-10-CM | POA: Insufficient documentation

## 2022-07-23 DIAGNOSIS — H60503 Unspecified acute noninfective otitis externa, bilateral: Secondary | ICD-10-CM | POA: Insufficient documentation

## 2022-07-23 DIAGNOSIS — J069 Acute upper respiratory infection, unspecified: Secondary | ICD-10-CM | POA: Insufficient documentation

## 2022-07-23 DIAGNOSIS — Y92099 Unspecified place in other non-institutional residence as the place of occurrence of the external cause: Secondary | ICD-10-CM | POA: Insufficient documentation

## 2022-07-23 DIAGNOSIS — Y9389 Activity, other specified: Secondary | ICD-10-CM | POA: Insufficient documentation

## 2022-07-23 LAB — REGIONAL CHEMICAL DEPEN SCRN, UR
Amphetamine,UR: NEGATIVE
Barbiturate,UR: NEGATIVE
Benzodiazepinen,UR: NEGATIVE
Cocaine/Metab,UR: NEGATIVE
Methadone Metab,UR: NEGATIVE
Opiates,UR: NEGATIVE
PCP,UR: NEGATIVE
THC Metabolite,UR: NEGATIVE
Tricyclics,UR: NEGATIVE

## 2022-07-23 LAB — CBC AND DIFFERENTIAL
Baso # K/uL: 0 10*3/uL (ref 0.0–0.2)
Basophil %: 0.3 %
Eos # K/uL: 0.1 10*3/uL (ref 0.0–0.5)
Eosinophil %: 1 %
Hematocrit: 40 % (ref 34–49)
Hemoglobin: 14.2 g/dL (ref 11.2–16.0)
IMM Granulocytes #: 0 10*3/uL (ref 0.0–0.0)
IMM Granulocytes: 0.3 %
Lymph # K/uL: 2 10*3/uL (ref 1.0–5.0)
Lymphocyte %: 21.9 %
MCH: 31 pg (ref 26–32)
MCHC: 35 g/dL (ref 32–36)
MCV: 87 fL (ref 75–100)
Mono # K/uL: 0.4 10*3/uL (ref 0.1–1.0)
Monocyte %: 4.5 %
Neut # K/uL: 6.6 10*3/uL — ABNORMAL HIGH (ref 1.5–6.5)
Nucl RBC # K/uL: 0 10*3/uL (ref 0.0–0.0)
Nucl RBC %: 0 /100 WBC (ref 0.0–0.2)
Platelets: 244 10*3/uL (ref 150–450)
RBC: 4.6 MIL/uL (ref 4.0–5.5)
RDW: 12.2 % (ref 0.0–15.0)
Seg Neut %: 72 %
WBC: 9.1 10*3/uL (ref 3.5–11.0)

## 2022-07-23 LAB — COMPREHENSIVE METABOLIC PANEL
ALT: 10 U/L (ref 0–35)
AST: 18 U/L (ref 0–35)
Albumin: 5 g/dL (ref 3.5–5.2)
Alk Phos: 71 U/L (ref 35–105)
Anion Gap: 15 (ref 7–16)
Bilirubin,Total: 0.5 mg/dL (ref 0.0–1.2)
CO2: 24 mmol/L (ref 20–28)
Calcium: 9.3 mg/dL (ref 8.8–10.2)
Chloride: 100 mmol/L (ref 96–108)
Creatinine: 0.63 mg/dL (ref 0.51–0.95)
Glucose: 104 mg/dL — ABNORMAL HIGH (ref 60–99)
Lab: 8 mg/dL (ref 6–20)
Potassium: 3.8 mmol/L (ref 3.3–4.6)
Sodium: 139 mmol/L (ref 133–145)
Total Protein: 8.3 g/dL — ABNORMAL HIGH (ref 6.3–7.7)
eGFR BY CREAT: 122 *

## 2022-07-23 LAB — ETHANOL: Ethanol: 297 mg/dL — ABNORMAL HIGH (ref 0–9)

## 2022-07-23 LAB — PREGNANCY TEST, SERUM: Preg,Serum: NEGATIVE

## 2022-07-23 MED ORDER — FLUTICASONE PROPIONATE 50 MCG/ACT NA SUSP *I*
1.0000 | Freq: Every day | NASAL | 5 refills | Status: DC
Start: 2022-07-23 — End: 2024-01-26

## 2022-07-23 MED ORDER — NEOMYCIN-POLYMYXIN-HC 3.5-10000-1 OT SOLN *I*
4.0000 [drp] | Freq: Three times a day (TID) | OTIC | 0 refills | Status: AC
Start: 2022-07-23 — End: 2022-07-30

## 2022-07-23 MED ORDER — THIAMINE HCL 100 MG/ML IJ SOLN *I*
100.0000 mg | Freq: Once | INTRAMUSCULAR | Status: AC
Start: 2022-07-23 — End: 2022-07-23
  Administered 2022-07-23: 100 mg via INTRAVENOUS
  Filled 2022-07-23: qty 2

## 2022-07-23 MED ORDER — SODIUM CHLORIDE 0.9 % IV BOLUS *I*
1000.0000 mL | Freq: Once | Status: AC
Start: 2022-07-23 — End: 2022-07-23
  Administered 2022-07-23: 1000 mL via INTRAVENOUS
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
## 2022-07-23 NOTE — Discharge Instructions (Signed)
Do not drink any alcohol  Increase p.o. fluid  Ice to the forehead  Tylenol 650 mg every 4 as needed for the pain, headaches  Come back to ER if any problem  Follow the primary care physician this week
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
## 2022-07-23 NOTE — Telephone Encounter (Signed)
Error

## 2022-07-23 NOTE — Progress Notes (Signed)
CC:   Chief Complaint   Patient presents with    Follow-up       HPI: Amanda Phelps is a 31 y.o. female who presented today for concerns of ear pain more on the left ear and jaw pain.  She states the approximately 3 to 4 days ago she came down with a head cold.  She states that she has tested at home for COVID and is come back negative.  She does note that her son had a viral head cold last week and they were utilizing over-the-counter medications.  She states that she has had chills and dizziness and been extremely tired but more over what has bothered her is her left ear pain and jaw pain.  She states that when she wakes up in the morning her nasal secretions are thick and yellow to green in color over the past 2 days but other than that they clear throughout the day.  She does state that due to the postnasal drip but will occasionally make her cough but it is is mostly controlled at this time.  She denies any fever, sore throat, difficulty swallowing eating or drinking.  Denies any GI issues and is voiding well.  She states that she has been utilizing over-the-counter medications but she has found no relief in her symptoms seem to be worsening.    Review of Systems:  See HPI      Medications reviewed in eRecord today and confirmed with the patient with Yes changes being made  Medications:    Current Outpatient Medications:     valACYclovir (VALTREX) 1 gm tablet, Take 1 tablet (1,000 mg total) by mouth 2 times daily, Disp: 10 tablet, Rfl: 0    levothyroxine (SYNTHROID, LEVOTHROID) 25 mcg tablet, TAKE 1 TABLET(25 MCG) BY MOUTH DAILY BEFORE BREAKFAST, Disp: 90 tablet, Rfl: 1    FLUoxetine (PROZAC) 20 mg capsule, Take 1 capsule (20 mg total) by mouth daily (Patient taking differently: Take 2 capsules (40 mg total) by mouth daily), Disp: 30 capsule, Rfl: 2    levonorgestrel, MIRENA, (MIRENA, 52 MG,) 20 MCG/24HR IUD, Mirena 20 mcg/24 hours (7 yrs) 52 mg intrauterine device  Take by intrauterine route., Disp: , Rfl:      neomycin-polymyxin-hydrocortisone (CORTISPORIN) otic solution, Place 4 drops into both ears 3 times daily for 7 days  for Outer Ear Canal Infection, Disp: 10 mL, Rfl: 0    fluticasone (FLONASE) 50 MCG/ACT nasal spray, Spray 1 spray into nostril daily, Disp: 16 g, Rfl: 5    acetaminophen (TYLENOL) 325 mg tablet, Take 2 tablets (650 mg total) by mouth every 6 hours as needed for Pain, Disp: 100 tablet, Rfl: 0    methylPREDNISolone (MEDROL PAK) 4 MG tablet pack, Take according to package directions (6 day supply) (Patient not taking: Reported on 05/26/2022), Disp: 21 tablet, Rfl: 0    albuterol HFA (PROVENTIL, VENTOLIN, PROAIR HFA) 108 (90 Base) MCG/ACT inhaler, INHALE 1-2 PUFFS EVERY 6 HOURS AS NEEDED FOR WHEEZING FOR SPASM OF LUNG AIR PASSAGES SHAKE WELL BEFORE EACH USE, Disp: 8.5 g, Rfl: 3    Allergies:  Allergy History as of 07/23/22       AZITHROMYCIN         Noted Status Severity Type Reaction    07/25/12 1300 Boxx, [name] 07/25/12 Active Low Allergy Rash              SULFAMETHOXAZOLE-TRIMETHOPRIM         Noted Status Severity Type Reaction  07/25/12 1300 Boxx, [name] 07/25/12 Active Low Allergy Rash              CONTRAST DYE         Noted Status Severity Type Reaction    09/02/21 2217 Karsten Ro, RN 09/02/21 Active High Allergy Anaphylaxis                    Family History:  Family History   Problem Relation Age of Onset    Cancer Maternal Grandfather         Lung cancer, smoker    Heart failure Maternal Grandfather     Thyroid disease Maternal Grandfather     Heart failure Maternal Grandmother     Diabetes Maternal Grandmother     Anemia Maternal Grandmother     COPD Maternal Grandmother     Osteoporosis Maternal Aunt     Depression Maternal Aunt     Thyroid disease Mother     Diabetes Mother     COPD Mother     Rheum arthritis Mother     Diverticulitis Father        Social History:  Social History     Socioeconomic History    Marital status: Significant Other    Number of children: 1    Years of  education: Designer, industrial/product   Occupational History    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     Comment: IUD palced 2019   Social History Narrative    Psychosocial Evaluation:        Lives with roommate and parents    Abuse: No    Depression symptoms:  no    School:  [employer] [place]    Patient is doing well in school:  yes    Grades:  Teaching laboratory technician or learning problems:  no    Peer relationships:  Good friends and boyfriend treats her well.    Family relationships:  Good    Future goals:  Marketing rep                 Vital Signs:  Vitals:    07/23/22 1028   BP: 110/74   Pulse: 79   Temp: 36.5 C (97.7 F)   Weight: 115.7 kg (255 lb)     Last 4 Weights    07/23/22 1028   Weight: 115.7 kg (255 lb)       Physical Exam:  General: Well developed/well nourished female in no acute distress   HEENT: Sclera clear, conjunctiva pink, TMs bilaterally with cloudy fluid behind and dull light reflex,++ erythema and edema noted in bilateral ear canals, no drainage or odor.  Negative lymph adenopathy, turbinates with erythema and edema, postnasal drainage clear, pharynx without exudate erythema or edema.  Pulmonary: Lungs clear to auscultation bilaterally with normal air entry, normal to percussion bilaterally, no adventitious breath sounds  Cardiovascular: S1/S2, regular rate/rhythm, no murmur appreciated  Gastrointestinal: Abdomen soft, non-distended and non-tender in all 4 quadrants, bs+x4, no masses or organomegaly palpated  Psychiatric: Mood and affect are good.  Appropriate groomed with good eye contact    Recent Lab Work:  No results found for this or any previous visit (from the past 336 hour(s)).        Assessment  1. Viral URI with cough        2. Acute otitis externa of both ears, unspecified type  neomycin-polymyxin-hydrocortisone (CORTISPORIN) otic solution             Plan  1.  Viral upper respiratory infection with cough-she will plan to increase fluids, Tylenol or ibuprofen as needed for discomfort we will plan Flonase nasal spray 1 spray each nostril to help with nasal secretions, over-the-counter throat lozenges or cough drops as needed.  Will plan follow-up in office if symptoms worsen or as needed    2.  Acute otitis externa of both ears-we will start Cortisporin drops 3 drops in each ear 3 times a day for the next 7 days.  Encouraged to utilize warm packs to ears to help with discomfort.  See #1.  Will plan follow-up in office if symptoms worsen and as needed

## 2022-07-28 ENCOUNTER — Encounter: Payer: Self-pay | Admitting: Primary Care

## 2022-07-29 NOTE — Telephone Encounter (Signed)
Spoke with patient, she has been scheduled. Thank you.

## 2022-07-29 NOTE — Telephone Encounter (Signed)
Please reach out to patient and offer her an appt.  Thanks.

## 2022-07-31 ENCOUNTER — Ambulatory Visit: Payer: BLUE CROSS/BLUE SHIELD | Admitting: Primary Care

## 2022-08-05 ENCOUNTER — Telehealth: Payer: Self-pay | Admitting: Primary Care

## 2022-08-05 NOTE — Telephone Encounter (Signed)
Spoke with patient, she has been scheduled. Thank you.

## 2022-08-05 NOTE — Telephone Encounter (Signed)
Patient canceled appt on 12/29.  Spoke to patient she states that she is feeling worse.  She reports that she has not taken any COVID tests.  Patient is taking Mucinex,  did use the ear drops patient reports that the ear drops did not help.  Vomiting/diarrhea.       What do you recommend?

## 2022-08-05 NOTE — Telephone Encounter (Signed)
Sinus congestion, congestion, vomiting, diarrhea - had cancelled her appointment 12/29 as she felt like she "was getting better" but the illness came back with a "vengeance"    Provider's schedule is full - please assess with Lowella Bandy and advise scheduling staff

## 2022-08-06 ENCOUNTER — Encounter: Payer: Self-pay | Admitting: Primary Care

## 2022-08-06 ENCOUNTER — Ambulatory Visit: Payer: BLUE CROSS/BLUE SHIELD | Attending: Primary Care | Admitting: Primary Care

## 2022-08-06 ENCOUNTER — Other Ambulatory Visit: Payer: Self-pay

## 2022-08-06 VITALS — BP 118/84 | HR 76 | Temp 97.0°F | Ht 70.0 in | Wt 250.2 lb

## 2022-08-06 DIAGNOSIS — Z20822 Contact with and (suspected) exposure to covid-19: Secondary | ICD-10-CM | POA: Insufficient documentation

## 2022-08-06 DIAGNOSIS — J019 Acute sinusitis, unspecified: Secondary | ICD-10-CM

## 2022-08-06 DIAGNOSIS — Z1152 Encounter for screening for COVID-19: Secondary | ICD-10-CM | POA: Insufficient documentation

## 2022-08-06 DIAGNOSIS — Z20828 Contact with and (suspected) exposure to other viral communicable diseases: Secondary | ICD-10-CM | POA: Insufficient documentation

## 2022-08-06 DIAGNOSIS — B9689 Other specified bacterial agents as the cause of diseases classified elsewhere: Secondary | ICD-10-CM | POA: Insufficient documentation

## 2022-08-06 DIAGNOSIS — J069 Acute upper respiratory infection, unspecified: Secondary | ICD-10-CM

## 2022-08-06 LAB — DATE/TIME NOT PROVIDED

## 2022-08-06 MED ORDER — AMOXICILLIN 875 MG PO TABS *I*
875.0000 mg | ORAL_TABLET | Freq: Two times a day (BID) | ORAL | 0 refills | Status: AC
Start: 2022-08-06 — End: 2022-08-16

## 2022-08-06 NOTE — Progress Notes (Signed)
CC:   Chief Complaint   Patient presents with    Cough     Coughing, congestion       HPI: Amanda Phelps is a 32 y.o. female who presented today for concerns of coughing and congestion.  Amanda Phelps states that she started feeling better but this past weekend suddenly her symptoms returned worse.  She states that she had nausea and vomiting as well as diarrhea this weekend and has had on and off again fever.  She states that she had increase in nasal congestion and postnasal drip causing her cough to worsen.  She denies any shortness of breath SpO2 in office today 99% on room air.  She states overall she feels that her fever is rising more at night and she is waking up in sweats.  She denies any coughing at night or difficulty sleeping.  She does note that her nausea vomiting and diarrhea has all subsided at this point in time.  She denies any difficulties eating or drinking.  She has been using over-the-counter nasal decongestions at this point in time.  No other concerns today.    Review of Systems:  See HPI    Medications reviewed in eRecord today and confirmed with the patient with Yes changes being made  Medications:    Current Outpatient Medications:     fluticasone (FLONASE) 50 MCG/ACT nasal spray, Spray 1 spray into nostril daily, Disp: 16 g, Rfl: 5    levothyroxine (SYNTHROID, LEVOTHROID) 25 mcg tablet, TAKE 1 TABLET(25 MCG) BY MOUTH DAILY BEFORE BREAKFAST, Disp: 90 tablet, Rfl: 1    FLUoxetine (PROZAC) 20 mg capsule, Take 1 capsule (20 mg total) by mouth daily (Patient taking differently: Take 2 capsules (40 mg total) by mouth daily), Disp: 30 capsule, Rfl: 2    levonorgestrel, MIRENA, (MIRENA, 52 MG,) 20 MCG/24HR IUD, Mirena 20 mcg/24 hours (7 yrs) 52 mg intrauterine device  Take by intrauterine route., Disp: , Rfl:     amoxicillin (AMOXIL) 875 mg tablet, Take 1 tablet (875 mg total) by mouth 2 times daily for 10 days  for Bacterial Endocarditis, Sinus Irritation and Congestion, Disp: 20 tablet, Rfl: 0     valACYclovir (VALTREX) 1 gm tablet, Take 1 tablet (1,000 mg total) by mouth 2 times daily, Disp: 10 tablet, Rfl: 0    acetaminophen (TYLENOL) 325 mg tablet, Take 2 tablets (650 mg total) by mouth every 6 hours as needed for Pain, Disp: 100 tablet, Rfl: 0    albuterol HFA (PROVENTIL, VENTOLIN, PROAIR HFA) 108 (90 Base) MCG/ACT inhaler, INHALE 1-2 PUFFS EVERY 6 HOURS AS NEEDED FOR WHEEZING FOR SPASM OF LUNG AIR PASSAGES SHAKE WELL BEFORE EACH USE, Disp: 8.5 g, Rfl: 3    Allergies:  Allergy History as of 08/06/22       AZITHROMYCIN         Noted Status Severity Type Reaction    07/25/12 1300 Boxx, [name] 07/25/12 Active Low Allergy Rash              SULFAMETHOXAZOLE-TRIMETHOPRIM         Noted Status Severity Type Reaction    07/25/12 1300 Boxx, [name] 07/25/12 Active Low Allergy Rash              CONTRAST DYE         Noted Status Severity Type Reaction    09/02/21 2217 Karsten Ro, RN 09/02/21 Active High Allergy Anaphylaxis  Family History:  Family History   Problem Relation Age of Onset    Cancer Maternal Grandfather         Lung cancer, smoker    Heart failure Maternal Grandfather     Thyroid disease Maternal Grandfather     Heart failure Maternal Grandmother     Diabetes Maternal Grandmother     Anemia Maternal Grandmother     COPD Maternal Grandmother     Osteoporosis Maternal Aunt     Depression Maternal Aunt     Thyroid disease Mother     Diabetes Mother     COPD Mother     Rheum arthritis Mother     Diverticulitis Father        Social History:  Social History     Socioeconomic History    Marital status: Significant Other    Number of children: 1    Years of education: Designer, industrial/product   Occupational History    Occupation: Special educational needs teacher: [employer]   Tobacco Use    Smoking status: Never    Smokeless tobacco: Never   Substance and Sexual Activity    Alcohol use: Yes     Comment: socially     Drug use: Not Currently     Types: Marijuana     Comment: occasional    Sexual activity:  Yes     Partners: Male     Birth control/protection: I.U.D.     Comment: IUD palced 2019   Social History Narrative    Psychosocial Evaluation:        Lives with roommate and parents    Abuse: No    Depression symptoms:  no    School:  [employer] [place]    Patient is doing well in school:  yes    Grades:  Teaching laboratory technician or learning problems:  no    Peer relationships:  Good friends and boyfriend treats her well.    Family relationships:  Good    Future goals:  Marketing rep                 Vital Signs:  Vitals:    08/06/22 1610   BP: 118/84   Pulse: 76   Temp: 36.1 C (97 F)   Weight: 113.5 kg (250 lb 4 oz)   Height: 1.778 m (5\' 10" )     Last 4 Weights    08/06/22 1610   Weight: 113.5 kg (250 lb 4 oz)       Physical Exam:  General: Well developed/well nourished female in no acute distress   EENT: Sclera clear, conjunctiva pink, TM bilaterally intact though ear canal with erythema and edema, turbinates with erythema and edema throughout, thick nasal drainage yellow/green in color, mucous membranes moist, pharynx with slight erythema no edema, no cervical adenopathy.  Positive tenderness noted with maxillary sinus palpation.  Pulmonary: Lungs clear to auscultation bilaterally with normal air entry, normal to percussion bilaterally, no adventitious breath sounds  Cardiovascular: S1/S2, regular rate/rhythm, no murmur appreciated  Gastrointestinal: Abdomen soft, non-distended and non-tender in all 4 quadrants, bs+x4, no masses or organomegaly palpated  Psychiatric: Mood and affect are good.  Appropriate groomed with good eye contact    Recent Lab Work:  Recent Results (from the past 336 hour(s))   CBC and differential    Collection Time: 07/23/22  8:09 PM   Result Value Ref Range    WBC 9.1 3.5 - 11.0 THOU/uL  RBC 4.6 4.0 - 5.5 MIL/uL    Hemoglobin 14.2 11.2 - 16.0 g/dL    Hematocrit 40 34 - 49 %    MCV 87 75 - 100 fL    MCH 31 26 - 32 pg    MCHC 35 32 - 36 g/dL    RDW 16.1 0.0 - 09.6 %    Platelets 244  150 - 450 THOU/uL    Seg Neut % 72.0 %    Lymphocyte % 21.9 %    Monocyte % 4.5 %    Eosinophil % 1.0 %    Basophil % 0.3 %    Neut # K/uL 6.6 (H) 1.5 - 6.5 THOU/uL    Lymph # K/uL 2.0 1.0 - 5.0 THOU/uL    Mono # K/uL 0.4 0.1 - 1.0 THOU/uL    Eos # K/uL 0.1 0.0 - 0.5 THOU/uL    Baso # K/uL 0.0 0.0 - 0.2 THOU/uL    Nucl RBC % 0.0 0.0 - 0.2 /100 WBC    Nucl RBC # K/uL 0.0 0.0 - 0.0 THOU/uL    IMM Granulocytes # 0.0 0.0 - 0.0 THOU/uL    IMM Granulocytes 0.3 %   Comprehensive metabolic panel    Collection Time: 07/23/22  8:09 PM   Result Value Ref Range    Sodium 139 133 - 145 mmol/L    Potassium 3.8 3.3 - 4.6 mmol/L    Chloride 100 96 - 108 mmol/L    CO2 24 20 - 28 mmol/L    Anion Gap 15 7 - 16    UN 8 6 - 20 mg/dL    Creatinine 0.45 4.09 - 0.95 mg/dL    eGFR BY CREAT 811 *    Glucose 104 (H) 60 - 99 mg/dL    Calcium 9.3 8.8 - 91.4 mg/dL    Total Protein 8.3 (H) 6.3 - 7.7 g/dL    Albumin 5.0 3.5 - 5.2 g/dL    Bilirubin,Total 0.5 0.0 - 1.2 mg/dL    AST 18 0 - 35 U/L    ALT 10 0 - 35 U/L    Alk Phos 71 35 - 105 U/L   Pregnancy Test, Serum    Collection Time: 07/23/22  8:09 PM   Result Value Ref Range    Preg,Serum NEG NEGATIVE   Ethanol    Collection Time: 07/23/22  8:09 PM   Result Value Ref Range    Ethanol 297 (H) 0 - 9 mg/dL   Regional chemical depen scrn, ur    Collection Time: 07/23/22  8:10 PM   Result Value Ref Range    THC Metabolite,UR NEG     PCP,UR NEG     Cocaine/Metab,UR NEG     Opiates,UR NEG     Amphetamine,UR NEG     Benzodiazepinen,UR NEG     Tricyclics,UR NEG     Methadone Metab,UR NEG     Barbiturate,UR NEG     Remark,UR See text           Assessment  1. Viral URI with cough  COVID/Influenza A & B/RSV NAAT (PCR)    COVID/Influenza A & B/RSV NAAT (PCR)      2. Acute bacterial sinusitis               1.  Viral upper URI with cough-COVID/influenza a and B as well as RSV swab done in office today we will call with those results.  Encouraged to continue with over-the-counter decongestants, Tylenol or  ibuprofen as needed for discomfort or fever.  Encouraged salt water gargles or throat drops, cough drops as needed for cough.  Increase fluids we will call with results when they return follow-up in office if symptoms worsen or as needed    2.  Acute bacterial sinusitis-amoxicillin 875 twice a day for the next 10 days.  Encouraged as stated in #1.  Follow-up in office if symptoms worsen and as needed  Plan

## 2022-08-07 ENCOUNTER — Telehealth: Payer: Self-pay | Admitting: Primary Care

## 2022-08-07 ENCOUNTER — Encounter: Payer: Self-pay | Admitting: Primary Care

## 2022-08-07 LAB — COVID/INFLUENZA A & B/RSV NAAT (PCR)
COVID-19 NAAT (PCR): NEGATIVE
Influenza A NAAT (PCR): NEGATIVE
Influenza B NAAT (PCR): NEGATIVE
RSV NAAT (PCR): NEGATIVE

## 2022-08-07 NOTE — Telephone Encounter (Signed)
Attempted to call patient. No answer. Unable to LVM per FYI. Will send MyChart message

## 2022-08-07 NOTE — Telephone Encounter (Signed)
COVID/ flu and RSV testing came back negative.  Continue antibiotic for sinus infection.  Increase fluids and Tylenol or ibuprofen as needed as needed.

## 2022-11-11 ENCOUNTER — Encounter: Payer: Self-pay | Admitting: Primary Care

## 2022-11-11 NOTE — Telephone Encounter (Signed)
This has been scheduled   Future Encounters      11/20/2022  7:40 AM Sch    FOLLOW UP VISIT    [facility]    Noniewicz, Loleta Books, NP

## 2022-11-20 ENCOUNTER — Other Ambulatory Visit: Payer: Self-pay

## 2022-11-20 ENCOUNTER — Ambulatory Visit: Payer: BLUE CROSS/BLUE SHIELD | Attending: Primary Care | Admitting: Primary Care

## 2022-11-20 ENCOUNTER — Encounter: Payer: Self-pay | Admitting: Primary Care

## 2022-11-20 VITALS — BP 108/72 | HR 86 | Temp 96.0°F | Wt 249.0 lb

## 2022-11-20 DIAGNOSIS — L0100 Impetigo, unspecified: Secondary | ICD-10-CM | POA: Insufficient documentation

## 2022-11-20 MED ORDER — MUPIROCIN 2 % EX OINT *I*
TOPICAL_OINTMENT | Freq: Three times a day (TID) | CUTANEOUS | 1 refills | Status: DC
Start: 2022-11-20 — End: 2022-12-16

## 2022-11-20 NOTE — Progress Notes (Signed)
CC:   Chief Complaint   Patient presents with    Other     Rash around mouth       HPI: Amanda Phelps is a 32 y.o. female who presented today for rash concerns of a rash around her mouth.  Tennyson states that she noticed it after head cold recently that she had.  She states today that it will start going away and then will work its way back up to her nasal area.  It has remained around the right side of her mouth next to her lips and she states that it will crust over at times.  She denies a rash anywhere else on her body.  She denies any fever, GI issues denies any difficulties eating drinking or swallowing.  No other concerns today.    Review of Systems:  HPI      Medications reviewed in eRecord today and confirmed with the patient with Yes changes being made  Medications:    Current Outpatient Medications:     levothyroxine (SYNTHROID, LEVOTHROID) 25 mcg tablet, TAKE 1 TABLET(25 MCG) BY MOUTH DAILY BEFORE BREAKFAST, Disp: 90 tablet, Rfl: 1    FLUoxetine (PROZAC) 20 mg capsule, Take 1 capsule (20 mg total) by mouth daily (Patient taking differently: Take 2 capsules (40 mg total) by mouth daily.), Disp: 30 capsule, Rfl: 2    levonorgestrel, MIRENA, (MIRENA, 52 MG,) 20 MCG/24HR IUD, Mirena 20 mcg/24 hours (7 yrs) 52 mg intrauterine device  Take by intrauterine route., Disp: , Rfl:     mupirocin (BACTROBAN) 2 % ointment, Apply topically 3 times daily for Impetigo. to the following areas: affected area, Disp: 22 g, Rfl: 1    fluticasone (FLONASE) 50 MCG/ACT nasal spray, Spray 1 spray into nostril daily, Disp: 16 g, Rfl: 5    valACYclovir (VALTREX) 1 gm tablet, Take 1 tablet (1,000 mg total) by mouth 2 times daily, Disp: 10 tablet, Rfl: 0    acetaminophen (TYLENOL) 325 mg tablet, Take 2 tablets (650 mg total) by mouth every 6 hours as needed for Pain, Disp: 100 tablet, Rfl: 0    albuterol HFA (PROVENTIL, VENTOLIN, PROAIR HFA) 108 (90 Base) MCG/ACT inhaler, INHALE 1-2 PUFFS EVERY 6 HOURS AS NEEDED FOR WHEEZING FOR  SPASM OF LUNG AIR PASSAGES SHAKE WELL BEFORE EACH USE, Disp: 8.5 g, Rfl: 3    Allergies:  Allergy History as of 11/20/22       AZITHROMYCIN         Noted Status Severity Type Reaction    07/25/12 1300 Boxx, [name] 07/25/12 Active Low Allergy Rash              SULFAMETHOXAZOLE-TRIMETHOPRIM         Noted Status Severity Type Reaction    07/25/12 1300 Boxx, [name] 07/25/12 Active Low Allergy Rash              CONTRAST DYE         Noted Status Severity Type Reaction    09/02/21 2217 Karsten Ro, RN 09/02/21 Active High Allergy Anaphylaxis                    Family History:  Family History   Problem Relation Age of Onset    Cancer Maternal Grandfather         Lung cancer, smoker    Heart failure Maternal Grandfather     Thyroid disease Maternal Grandfather     Heart failure Maternal Grandmother  Diabetes Maternal Grandmother     Anemia Maternal Grandmother     COPD Maternal Grandmother     Osteoporosis Maternal Aunt     Depression Maternal Aunt     Thyroid disease Mother     Diabetes Mother     COPD Mother     Rheum arthritis Mother     Diverticulitis Father        Social History:  Social History     Socioeconomic History    Marital status: Significant Other    Number of children: 1    Years of education: Designer, industrial/product   Occupational History    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     Comment: IUD palced 2019   Social History Narrative    Psychosocial Evaluation:        Lives with roommate and parents    Abuse: No    Depression symptoms:  no    School:  [employer] [place]    Patient is doing well in school:  yes    Grades:  Teaching laboratory technician or learning problems:  no    Peer relationships:  Good  friends and boyfriend treats her well.    Family relationships:  Good    Future goals:  Marketing rep                 Vital Signs:  Vitals:    11/20/22 0741   BP: 108/72   Pulse: 86   Temp: 35.6 C (96 F)   Weight: 112.9 kg (249 lb)     Last 4 Weights    11/20/22 0741   Weight: 112.9 kg (249 lb)       Physical Exam:  General: Well developed/well nourished female in no acute distress   Pulmonary: Lungs clear to auscultation bilaterally with normal air entry, normal to percussion bilaterally, no adventitious breath sounds  Cardiovascular: S1/S2, regular rate/rhythm, no murmur appreciated  Integumentary: Small maculopapular rash around right side of mouth trailing up to right nare.  Yellow crusting noted, typical impetigo looking rash.  Psychiatric: Mood and affect are good.  Appropriate groomed with good eye contact    Recent Lab Work:  No results found for this or any previous visit (from the past 336 hour(s)).       Assessment  1. Impetigo  mupirocin (BACTROBAN) 2 % ointment             Plan  1.  Impetigo-classic impetigo looking rash after upper respiratory infection.  Will start Bactroban to be applied to the area up to 3 times a day.  Encouraged to continue treatment for at least 3 days after rash has dissipated.  Encouraged to wash hands frequently as this can be very contagious.  Encouraged if symptoms do not show signs of resolving by Monday to contact office for further evaluation.  We will plan follow-up in office if symptoms worsen or as needed

## 2022-12-16 ENCOUNTER — Other Ambulatory Visit: Payer: Self-pay

## 2022-12-16 ENCOUNTER — Ambulatory Visit: Payer: BLUE CROSS/BLUE SHIELD | Attending: Primary Care | Admitting: Primary Care

## 2022-12-16 ENCOUNTER — Encounter: Payer: Self-pay | Admitting: Primary Care

## 2022-12-16 VITALS — BP 120/73 | HR 110 | Temp 97.2°F | Wt 253.0 lb

## 2022-12-16 DIAGNOSIS — R21 Rash and other nonspecific skin eruption: Secondary | ICD-10-CM | POA: Insufficient documentation

## 2022-12-16 MED ORDER — MOMETASONE FUROATE 0.1 % EX CREA *I*
TOPICAL_CREAM | Freq: Every day | CUTANEOUS | 1 refills | Status: AC
Start: 2022-12-16 — End: ?

## 2022-12-16 NOTE — Progress Notes (Signed)
CC:   Chief Complaint   Patient presents with    Follow-up       HPI: Amanda Phelps is a 32 y.o. female who presented today for concerns of rash on face getting worse.  Amanda Phelps was seen in office in April and diagnosed with impetigo.  She states she has been utilizing the Bactroban but it does not seem to be getting any better.  She does note that her symptoms were off and on happening around February and then worsened in April when she was seen in office.  Today she presents and states that sometimes she feels tingling and itching at the site and feels as though the area may have started to get a bit bigger.  Prior to seeing me in office in April she had just got over a respiratory illness and it was thought that this was impetigo possibly from nasal drainage zinc irritation and possible infection at site.  No other concerns today.    Review of Systems:  HPI      Medications reviewed in eRecord today and confirmed with the patient with Yes changes being made  Medications:    Current Outpatient Medications:     valACYclovir (VALTREX) 1 gm tablet, Take 1 tablet (1,000 mg total) by mouth 2 times daily, Disp: 10 tablet, Rfl: 0    levothyroxine (SYNTHROID, LEVOTHROID) 25 mcg tablet, TAKE 1 TABLET(25 MCG) BY MOUTH DAILY BEFORE BREAKFAST, Disp: 90 tablet, Rfl: 1    FLUoxetine (PROZAC) 20 mg capsule, Take 1 capsule (20 mg total) by mouth daily, Disp: 30 capsule, Rfl: 2    levonorgestrel, MIRENA, (MIRENA, 52 MG,) 20 MCG/24HR IUD, Mirena 20 mcg/24 hours (7 yrs) 52 mg intrauterine device  Take by intrauterine route., Disp: , Rfl:     mometasone (ELOCON) 0.1 % cream, Apply topically daily. as a thin film to the following areas: affected areas, Disp: 15 g, Rfl: 1    fluticasone (FLONASE) 50 MCG/ACT nasal spray, Spray 1 spray into nostril daily, Disp: 16 g, Rfl: 5    acetaminophen (TYLENOL) 325 mg tablet, Take 2 tablets (650 mg total) by mouth every 6 hours as needed for Pain, Disp: 100 tablet, Rfl: 0    albuterol HFA  (PROVENTIL, VENTOLIN, PROAIR HFA) 108 (90 Base) MCG/ACT inhaler, INHALE 1-2 PUFFS EVERY 6 HOURS AS NEEDED FOR WHEEZING FOR SPASM OF LUNG AIR PASSAGES SHAKE WELL BEFORE EACH USE, Disp: 8.5 g, Rfl: 3    Allergies:  Allergy History as of 12/16/22       AZITHROMYCIN         Noted Status Severity Type Reaction    07/25/12 1300 Amanda Phelps, [name] 07/25/12 Active Low Allergy Rash              SULFAMETHOXAZOLE-TRIMETHOPRIM         Noted Status Severity Type Reaction    07/25/12 1300 Amanda Phelps, [name] 07/25/12 Active Low Allergy Rash              CONTRAST DYE         Noted Status Severity Type Reaction    09/02/21 2217 Karsten Ro, RN 09/02/21 Active High Allergy Anaphylaxis                    Family History:  Family History   Problem Relation Age of Onset    Cancer Maternal Grandfather         Lung cancer, smoker    Heart failure Maternal Grandfather  Thyroid disease Maternal Grandfather     Heart failure Maternal Grandmother     Diabetes Maternal Grandmother     Anemia Maternal Grandmother     COPD Maternal Grandmother     Osteoporosis Maternal Aunt     Depression Maternal Aunt     Thyroid disease Mother     Diabetes Mother     COPD Mother     Rheum arthritis Mother     Diverticulitis Father        Social History:  Social History     Socioeconomic History    Marital status: Significant Other    Number of children: 1    Years of education: Designer, industrial/product   Occupational History    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     Comment: IUD palced 2019   Social History Narrative    Psychosocial Evaluation:        Lives with roommate and parents    Abuse: No    Depression symptoms:  no    School:  [employer] [place]    Patient is  doing well in school:  yes    Grades:  Teaching laboratory technician or learning problems:  no    Peer relationships:  Good friends and boyfriend treats her well.    Family relationships:  Good    Future goals:  Marketing rep                 Vital Signs:  Vitals:    12/16/22 1407   BP: 120/73   Pulse: 110   Temp: 36.2 C (97.2 F)   Weight: 114.8 kg (253 lb)     Last 4 Weights    12/16/22 1407   Weight: 114.8 kg (253 lb)       Physical Exam:  General: Well developed/well nourished female in no acute distress   Pulmonary: Lungs clear to auscultation bilaterally with normal air entry, normal to percussion bilaterally, no adventitious breath sounds  Cardiovascular: S1/S2, regular rate/rhythm, no murmur appreciated  Integumentary: Small raised maculopapular rash without drainage starting at near on right side and working down crease of cheek-please see images on file  Psychiatric: Mood and affect are good.  Appropriate groomed with good eye contact    Recent Lab Work:  No results found for this or any previous visit (from the past 336 hour(s)).       Assessment  1. Rash and nonspecific skin eruption  AMB eConsult to Adult Dermatology             Plan  1.  Rash and nonspecific skin eruption-we will plan to start her on con cream twice daily.  Encouraged to apply a very minimal amount to the area twice daily.  Made aware that this is a steroid cream and can sometimes cause pigmentation changes in color.  Encouraged if no response in the next 2 weeks to contact office.  She did give me permission to add pictures to the chart and do an E consult for dermatology.  Referral to  E consult for dermatology done today and p images attached.  Plan follow-up in office as needed and I will call her with with the dermatology's response.

## 2022-12-18 ENCOUNTER — Other Ambulatory Visit: Payer: BLUE CROSS/BLUE SHIELD | Admitting: Dermatology

## 2022-12-18 ENCOUNTER — Telehealth: Payer: Self-pay | Admitting: Primary Care

## 2022-12-18 DIAGNOSIS — L710 Perioral dermatitis: Secondary | ICD-10-CM

## 2022-12-18 MED ORDER — METRONIDAZOLE 0.75 % EX CREAM *A*
TOPICAL_CREAM | Freq: Every day | CUTANEOUS | 3 refills | Status: DC
Start: 2022-12-18 — End: 2024-06-06

## 2022-12-18 NOTE — Provider Consult (Signed)
Consulting Provider Response     Dermatology E-Consult    Question/History        I am requesting an e-Consult for my patient, Amanda Phelps, a 32 y.o. year old female for a rash.     MY CLINICAL QUESTION:  treatment for rash on face     Rash: on face around mouth and right nare (part of body)   Associated symptoms: itching, tingling, and redness (itching, bleeding, pain)   Duration: 3+ months   Prior treatments: Bactroban as previously has rash with yellow crusting. No issues with drainage or crusting any longer but is has not gone away.   Photographs  Reviewed in Media Tab    Excellent    Please visit the following website to see an excellent and concise guide for taking medical photographs:  [email or web address]    Assessment    Perioral dermatitis      Plan    This is a variant of rosacea that is worsened by topical steroids, even mild ones like hydrocortisone.  It generally clears with topical anti inflammatory antibiotics such as metronidazole cream.  She can apply it once daily until clear, and this may take between 6-12 weeks for complete response. She may or may not need long term maintenance treatment.      - If the patient's condition does not improve significantly with the above recommendations within 3 months, please have the patient call for a Dermatology appointment.   - Please do not place a separate referral to Dermatology. This eConsult serves as a referral for this condition for up to 1 year (if needed).        Triage/Scheduling Instructions  Recommend No Referral - PCP Management    If the patient calls for a follow-up appointment after having tried the recommendations in this eConsult, please schedule as Semi-Urgent (~4 weeks) with: any provider.    Time Spent: 5 minutes    This eConsult is focused on the specific clinical question(s) asked by the referring clinician, is based on the clinical data available to me, the consulting physician at  the time off the request, and is furnished without benefit of a comprehensive evaluation of physical examination of the patient by me. The guidance set forth in the eConsult note will need to be interpreted in light of any clinical issues not known to me or any changes in patient status that I may not be aware of at the time of filing this eConsult. If further consultation is necessary, an in-person visit with me or another member of our group is an option.    Cindy Hazy, MD  Dermatology Attending

## 2022-12-18 NOTE — Telephone Encounter (Signed)
As per dermatology we will send prescription in for Metronidazole cream to be applied to the area daily until clear.  Will inform her that they state it may take 6 to 12 weeks for complete response.  This could need long-term maintenance treatment.  If in 6 to 12 weeks she is still having issues we will plan to refer back for semiurgent appointment.

## 2023-02-10 ENCOUNTER — Encounter: Payer: Self-pay | Admitting: Primary Care

## 2023-02-19 ENCOUNTER — Other Ambulatory Visit: Payer: Self-pay

## 2023-02-19 ENCOUNTER — Ambulatory Visit: Payer: BLUE CROSS/BLUE SHIELD | Attending: Primary Care | Admitting: Primary Care

## 2023-02-19 VITALS — BP 119/73 | HR 91 | Temp 97.2°F | Wt 253.6 lb

## 2023-02-19 DIAGNOSIS — F909 Attention-deficit hyperactivity disorder, unspecified type: Secondary | ICD-10-CM | POA: Insufficient documentation

## 2023-02-19 MED ORDER — ATOMOXETINE HCL 25 MG PO CAPS *I*
25.0000 mg | ORAL_CAPSULE | Freq: Every day | ORAL | 1 refills | Status: DC
Start: 2023-02-19 — End: 2023-03-19

## 2023-02-19 NOTE — Progress Notes (Signed)
CC: ADHD symptoms    HPI: Amanda Phelps is a 32 y.o. female who presented today for concerns of ADHD. Allision states that she  was diagnosed as a young child with ADHD and was started on Adderall XR she notes that the medication made her anxiety worse so over the years she has built up coping skills that seemed to work well up until a couple of months ago. She states that her Prozac really helps for her anxiety and depression but she is having an increased difficulty with concentration and is unable to finish tasks at work. She states that she is on a time line at work and is more and more  falling  behind and unable to make deadlines. She notes that at home she feels as though she use to be able to multitask but lately is unable to get things completed. She states  for example the other day she went to water plants and when she got to the laundry room she saw something else was unfinished and started that and then at the end of the day realized she had not completed either of those tasks and she had gone on to something else. She notes even her hobbies at home are not enjoyable as she can not seem to get them done.  She is wondering if there are other forms of ADHD medications we could trial. No other concerns today.     Review of Systems:  See HPI      Medications reviewed in eRecord today and confirmed with the patient with Yes changes being made  Medications:    Current Outpatient Medications:     atomoxetine (STRATTERA) 25 mg capsule, Take 1 capsule (25 mg total) by mouth daily for Attention Deficit Hyperactivity Disorder. Swallow whole. Do not open, crush, or chew., Disp: 30 capsule, Rfl: 1    metroNIDAZOLE (METROCREAM) 0.75 % cream, Apply topically daily. to the following areas: Face until clear, Disp: 45 g, Rfl: 3    mometasone (ELOCON) 0.1 % cream, Apply topically daily. as a thin film to the following areas: affected areas, Disp: 15 g, Rfl: 1    fluticasone (FLONASE) 50 MCG/ACT nasal spray, Spray 1  spray into nostril daily, Disp: 16 g, Rfl: 5    valACYclovir (VALTREX) 1 gm tablet, Take 1 tablet (1,000 mg total) by mouth 2 times daily, Disp: 10 tablet, Rfl: 0    acetaminophen (TYLENOL) 325 mg tablet, Take 2 tablets (650 mg total) by mouth every 6 hours as needed for Pain, Disp: 100 tablet, Rfl: 0    levothyroxine (SYNTHROID, LEVOTHROID) 25 mcg tablet, TAKE 1 TABLET(25 MCG) BY MOUTH DAILY BEFORE BREAKFAST, Disp: 90 tablet, Rfl: 1    FLUoxetine (PROZAC) 20 mg capsule, Take 1 capsule (20 mg total) by mouth daily, Disp: 30 capsule, Rfl: 2    albuterol HFA (PROVENTIL, VENTOLIN, PROAIR HFA) 108 (90 Base) MCG/ACT inhaler, INHALE 1-2 PUFFS EVERY 6 HOURS AS NEEDED FOR WHEEZING FOR SPASM OF LUNG AIR PASSAGES SHAKE WELL BEFORE EACH USE, Disp: 8.5 g, Rfl: 3    levonorgestrel, MIRENA, (MIRENA, 52 MG,) 20 MCG/24HR IUD, Mirena 20 mcg/24 hours (7 yrs) 52 mg intrauterine device  Take by intrauterine route., Disp: , Rfl:     Allergies:  Allergy History as of 02/19/23       AZITHROMYCIN         Noted Status Severity Type Reaction    07/25/12 1300 Boxx, [name] 07/25/12 Active Low Allergy Rash  SULFAMETHOXAZOLE-TRIMETHOPRIM         Noted Status Severity Type Reaction    07/25/12 1300 Boxx, [name] 07/25/12 Active Low Allergy Rash              CONTRAST DYE         Noted Status Severity Type Reaction    09/02/21 2217 Karsten Ro, RN 09/02/21 Active High Allergy Anaphylaxis                    Family History:  Family History   Problem Relation Age of Onset    Cancer Maternal Grandfather         Lung cancer, smoker    Heart failure Maternal Grandfather     Thyroid disease Maternal Grandfather     Heart failure Maternal Grandmother     Diabetes Maternal Grandmother     Anemia Maternal Grandmother     COPD Maternal Grandmother     Osteoporosis Maternal Aunt     Depression Maternal Aunt     Thyroid disease Mother     Diabetes Mother     COPD Mother     Rheum arthritis Mother     Diverticulitis Father        Social  History:  Social History     Socioeconomic History    Marital status: Significant Other    Number of children: 1    Years of education: Designer, industrial/product   Occupational History    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     Comment: IUD palced 2019   Social History Narrative    Psychosocial Evaluation:        Lives with roommate and parents    Abuse: No    Depression symptoms:  no    School:  [employer] [place]    Patient is doing well in school:  yes    Grades:  Teaching laboratory technician or learning problems:  no    Peer relationships:  Good friends and boyfriend treats her well.    Family relationships:  Good    Future goals:  Marketing rep                 Vital Signs:  Vitals:    02/19/23 1248   BP: 119/73   Pulse: 91   Temp: 36.2 C (97.2 F)   Weight: 115 kg (253 lb 9.6 oz)     Last 4 Weights    02/19/23 1248   Weight: 115 kg (253 lb 9.6 oz)       Physical Exam:  General: Well developed/well nourished female in no acute distress   Pulmonary: Lungs clear to auscultation bilaterally with normal air entry, normal to percussion bilaterally, no adventitious breath sounds  Cardiovascular: S1/S2, regular rate/rhythm, no murmur appreciated  Gastrointestinal: Abdomen soft, non-distended and non-tender in all 4 quadrants, bs+x4, no masses or organomegaly palpated  Psychiatric: Mood and affect are good.  Appropriate groomed with good eye contact    Recent Lab Work:  No results found for this  or any previous visit (from the past 336 hour(s)).       Assessment  1. Adult ADHD  atomoxetine (STRATTERA) 25 mg capsule             Plan  ADHD, Adult- We discussed several options and will trial on Strattera at this time. Risks verses  benefits reviewed, possible side effects also reviewed. She would like to move forward with Strattera at this time. Prescription sent to pharmacy for Strattera 25 mg 1 tab by mouth daily. Follow up in office in 4 weeks and PRN.

## 2023-02-22 ENCOUNTER — Encounter: Payer: Self-pay | Admitting: Primary Care

## 2023-03-17 ENCOUNTER — Telehealth: Payer: Self-pay | Admitting: Primary Care

## 2023-03-18 ENCOUNTER — Ambulatory Visit: Payer: BLUE CROSS/BLUE SHIELD | Admitting: Primary Care

## 2023-03-19 ENCOUNTER — Ambulatory Visit: Payer: BLUE CROSS/BLUE SHIELD | Attending: Primary Care | Admitting: Primary Care

## 2023-03-19 ENCOUNTER — Encounter: Payer: Self-pay | Admitting: Primary Care

## 2023-03-19 ENCOUNTER — Other Ambulatory Visit: Payer: Self-pay

## 2023-03-19 VITALS — BP 101/70 | HR 97 | Temp 97.7°F | Wt 249.0 lb

## 2023-03-19 DIAGNOSIS — Z13220 Encounter for screening for lipoid disorders: Secondary | ICD-10-CM | POA: Insufficient documentation

## 2023-03-19 DIAGNOSIS — E559 Vitamin D deficiency, unspecified: Secondary | ICD-10-CM | POA: Insufficient documentation

## 2023-03-19 DIAGNOSIS — G43909 Migraine, unspecified, not intractable, without status migrainosus: Secondary | ICD-10-CM | POA: Insufficient documentation

## 2023-03-19 DIAGNOSIS — F419 Anxiety disorder, unspecified: Secondary | ICD-10-CM | POA: Insufficient documentation

## 2023-03-19 DIAGNOSIS — F909 Attention-deficit hyperactivity disorder, unspecified type: Secondary | ICD-10-CM | POA: Insufficient documentation

## 2023-03-19 DIAGNOSIS — E538 Deficiency of other specified B group vitamins: Secondary | ICD-10-CM | POA: Insufficient documentation

## 2023-03-19 DIAGNOSIS — E039 Hypothyroidism, unspecified: Secondary | ICD-10-CM | POA: Insufficient documentation

## 2023-03-19 MED ORDER — ATOMOXETINE HCL 40 MG PO CAPS *I*
40.0000 mg | ORAL_CAPSULE | Freq: Every day | ORAL | 5 refills | Status: DC
Start: 2023-03-19 — End: 2023-05-14

## 2023-03-19 NOTE — Progress Notes (Signed)
CC:   Chief Complaint   Patient presents with    Follow-up       HPI: Amanda Phelps is a 32 y.o. female who presented today for follow-up ADHD.  Last office visit we started Amanda Phelps on Strattera 25 mg p.o. daily.  She states that things are going very well.  She notes that she feels that they are helping somewhat at this point in time with her concentration issues but feels that she is not completely there at this point.  She states that she feels that she can concentrate better at home on the computer but notes that with any caffeine in her diet it seems to make things worsen.  She does also note that she has had a decrease in appetite which has been very helpful to help with her weight loss journey.  She is lost approximately 5 pounds over the last month but states that she is actually choosing healthier foods when she is eating.  She notes that she is eating more meals throughout the day and not necessarily snacking on bad foods.  She denies any nausea or vomiting.  She does note that she had some insomnia in the very beginning when starting the medication but this is started to resolve at this time.  Overall she states things are going well and denies any other concerns today.    Review of Systems:  See HPI      Medications reviewed in eRecord today and confirmed with the patient with Yes changes being made  Medications:    Current Outpatient Medications:     metroNIDAZOLE (METROCREAM) 0.75 % cream, Apply topically daily. to the following areas: Face until clear, Disp: 45 g, Rfl: 3    fluticasone (FLONASE) 50 MCG/ACT nasal spray, Spray 1 spray into nostril daily, Disp: 16 g, Rfl: 5    valACYclovir (VALTREX) 1 gm tablet, Take 1 tablet (1,000 mg total) by mouth 2 times daily, Disp: 10 tablet, Rfl: 0    levothyroxine (SYNTHROID, LEVOTHROID) 25 mcg tablet, TAKE 1 TABLET(25 MCG) BY MOUTH DAILY BEFORE BREAKFAST, Disp: 90 tablet, Rfl: 1    FLUoxetine (PROZAC) 20 mg capsule, Take 1 capsule (20 mg total) by mouth  daily, Disp: 30 capsule, Rfl: 2    levonorgestrel, MIRENA, (MIRENA, 52 MG,) 20 MCG/24HR IUD, Mirena 20 mcg/24 hours (7 yrs) 52 mg intrauterine device  Take by intrauterine route., Disp: , Rfl:     atomoxetine (STRATTERA) 40 mg capsule, Take 1 capsule (40 mg total) by mouth daily for Attention Deficit Hyperactivity Disorder., Disp: 30 capsule, Rfl: 5    mometasone (ELOCON) 0.1 % cream, Apply topically daily. as a thin film to the following areas: affected areas, Disp: 15 g, Rfl: 1    acetaminophen (TYLENOL) 325 mg tablet, Take 2 tablets (650 mg total) by mouth every 6 hours as needed for Pain (Patient not taking: Reported on 03/19/2023), Disp: 100 tablet, Rfl: 0    albuterol HFA (PROVENTIL, VENTOLIN, PROAIR HFA) 108 (90 Base) MCG/ACT inhaler, INHALE 1-2 PUFFS EVERY 6 HOURS AS NEEDED FOR WHEEZING FOR SPASM OF LUNG AIR PASSAGES SHAKE WELL BEFORE EACH USE, Disp: 8.5 g, Rfl: 3    Allergies:  Allergy History as of 03/19/23       AZITHROMYCIN         Noted Status Severity Type Reaction    07/25/12 1300 Amanda Phelps, Amanda Phelps 07/25/12 Active Low Allergy Rash              SULFAMETHOXAZOLE-TRIMETHOPRIM  Noted Status Severity Type Reaction    07/25/12 1300 Amanda Phelps, Amanda Phelps 07/25/12 Active Low Allergy Rash              CONTRAST DYE         Noted Status Severity Type Reaction    09/02/21 2217 Amanda Ro, RN 09/02/21 Active High Allergy Anaphylaxis                    Family History:  Family History   Problem Relation Age of Onset    Cancer Maternal Grandfather         Lung cancer, smoker    Heart failure Maternal Grandfather     Thyroid disease Maternal Grandfather     Heart failure Maternal Grandmother     Diabetes Maternal Grandmother     Anemia Maternal Grandmother     COPD Maternal Grandmother     Osteoporosis Maternal Aunt     Depression Maternal Aunt     Thyroid disease Mother     Diabetes Mother     COPD Mother     Rheum arthritis Mother     Diverticulitis Father        Social History:  Social History     Socioeconomic  History    Marital status: Significant Other    Number of children: 1    Years of education: Designer, industrial/product   Occupational History    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     Comment: IUD palced 2019   Social History Narrative    Psychosocial Evaluation:        Lives with roommate and parents    Abuse: No    Depression symptoms:  no    School:  [employer] [place]    Patient is doing well in school:  yes    Grades:  Teaching laboratory technician or learning problems:  no    Peer relationships:  Good friends and boyfriend treats her well.    Family relationships:  Good    Future goals:  Marketing rep                 Vital Signs:  Vitals:    03/19/23 1258   BP: 101/70   Pulse: 97   Temp: 36.5 C (97.7 F)   Weight: 112.9 kg (249 lb)     Last 4 Weights    03/19/23 1258   Weight: 112.9 kg (249 lb)       Physical Exam:  General: Well developed/well nourished female in no acute distress   Pulmonary: Lungs clear to auscultation bilaterally with normal air entry, normal to percussion bilaterally, no adventitious breath sounds  Cardiovascular: S1/S2, regular rate/rhythm, no murmur, rubs or gallops appreciated  Gastrointestinal: Abdomen soft, non-distended and non-tender in all 4 quadrants, bs+x4, no masses or organomegaly palpated  Psychiatric: Mood and affect are good.  Appropriate groomed with good eye contact    Recent Lab Work:  No results found for this or any previous visit (from the past 336 hour(s)).  Assessment  1. Adult ADHD  atomoxetine (STRATTERA) 40 mg capsule             Plan  .  ADHD adult-will plan to increase to [facility] to 40 milligrams p.o. daily.  Encouraged to take first thing in the morning.  Encouraged  to make sure that she is eating 3 small meals portion wise in the day and 2 small snacks in a day that are healthy choices.  Encouraged if symptoms worsen for sleeping to contact office for further evaluation if not we will plan to see her back here in 2 months for annual physical exam and as needed    Lab work to be done prior to next office visit.

## 2023-04-02 ENCOUNTER — Other Ambulatory Visit: Payer: Self-pay | Admitting: Primary Care

## 2023-04-02 DIAGNOSIS — E039 Hypothyroidism, unspecified: Secondary | ICD-10-CM

## 2023-04-02 MED ORDER — LEVOTHYROXINE SODIUM 25 MCG PO TABS *I*
25.0000 ug | ORAL_TABLET | Freq: Every day | ORAL | 1 refills | Status: DC
Start: 2023-04-02 — End: 2023-11-01

## 2023-04-02 NOTE — Telephone Encounter (Signed)
Patient needs refill    levothyroxine (SYNTHROID, LEVOTHROID) 25 mcg tablet     [facility] [address]

## 2023-04-19 ENCOUNTER — Other Ambulatory Visit
Admission: RE | Admit: 2023-04-19 | Discharge: 2023-04-19 | Disposition: A | Discharge status: Home / Self Care | Payer: BLUE CROSS/BLUE SHIELD | Source: Ambulatory Visit | Attending: Primary Care | Admitting: Primary Care

## 2023-04-19 DIAGNOSIS — E559 Vitamin D deficiency, unspecified: Secondary | ICD-10-CM | POA: Insufficient documentation

## 2023-04-19 DIAGNOSIS — F909 Attention-deficit hyperactivity disorder, unspecified type: Secondary | ICD-10-CM | POA: Insufficient documentation

## 2023-04-19 DIAGNOSIS — Z13220 Encounter for screening for lipoid disorders: Secondary | ICD-10-CM | POA: Insufficient documentation

## 2023-04-19 DIAGNOSIS — E039 Hypothyroidism, unspecified: Secondary | ICD-10-CM | POA: Insufficient documentation

## 2023-04-19 DIAGNOSIS — F419 Anxiety disorder, unspecified: Secondary | ICD-10-CM | POA: Insufficient documentation

## 2023-04-19 DIAGNOSIS — E538 Deficiency of other specified B group vitamins: Secondary | ICD-10-CM | POA: Insufficient documentation

## 2023-04-19 LAB — CBC AND DIFFERENTIAL
Baso # K/uL: 0 10*3/uL (ref 0.0–0.2)
Eos # K/uL: 0.1 10*3/uL (ref 0.0–0.5)
Hematocrit: 38 % (ref 34–49)
Hemoglobin: 13.1 g/dL (ref 11.2–16.0)
IMM Granulocytes #: 0 10*3/uL (ref 0.0–0.0)
IMM Granulocytes: 0.4 %
Lymph # K/uL: 1.4 10*3/uL (ref 1.0–5.0)
MCV: 89 fL (ref 75–100)
Mono # K/uL: 0.6 10*3/uL (ref 0.1–1.0)
Neut # K/uL: 5.7 10*3/uL (ref 1.5–6.5)
Nucl RBC # K/uL: 0 10*3/uL (ref 0.0–0.0)
Nucl RBC %: 0 /100 WBC (ref 0.0–0.2)
Platelets: 252 10*3/uL (ref 150–450)
RBC: 4.3 MIL/uL (ref 4.0–5.5)
RDW: 12.7 % (ref 0.0–15.0)
Seg Neut %: 73 %
WBC: 7.7 10*3/uL (ref 3.5–11.0)

## 2023-04-19 LAB — VITAMIN D: 25-OH Vit Total: 32 ng/mL (ref 30–60)

## 2023-04-19 LAB — COMPREHENSIVE METABOLIC PANEL
ALT: 14 U/L (ref 0–35)
AST: 17 U/L (ref 0–35)
Albumin: 4.5 g/dL (ref 3.5–5.2)
Alk Phos: 57 U/L (ref 35–105)
Anion Gap: 11 (ref 7–16)
Bilirubin,Total: 0.4 mg/dL (ref 0.0–1.2)
CO2: 26 mmol/L (ref 20–28)
Calcium: 9.3 mg/dL (ref 8.8–10.2)
Chloride: 101 mmol/L (ref 96–108)
Creatinine: 0.69 mg/dL (ref 0.51–0.95)
Glucose: 97 mg/dL (ref 60–99)
Lab: 9 mg/dL (ref 6–20)
Potassium: 4.2 mmol/L (ref 3.3–4.6)
Sodium: 138 mmol/L (ref 133–145)
Total Protein: 7.5 g/dL (ref 6.3–7.7)
eGFR BY CREAT: 119 *

## 2023-04-19 LAB — LIPID PANEL
Chol/HDL Ratio: 2.7
Cholesterol: 174 mg/dL
HDL: 65 mg/dL — ABNORMAL HIGH (ref 40–60)
LDL Calculated: 82 mg/dL
Non HDL Cholesterol: 109 mg/dL
Triglycerides: 134 mg/dL

## 2023-04-19 LAB — VITAMIN B12: Vitamin B12: 561 pg/mL (ref 232–1245)

## 2023-04-19 LAB — T4, FREE: Free T4: 1 ng/dL (ref 0.9–1.7)

## 2023-04-19 LAB — TSH: TSH: 3.75 u[IU]/mL (ref 0.27–4.20)

## 2023-04-20 ENCOUNTER — Telehealth: Payer: Self-pay | Admitting: Primary Care

## 2023-04-20 NOTE — Telephone Encounter (Signed)
Labs are all normal.  Follow-up with PCP as scheduled.

## 2023-04-20 NOTE — Telephone Encounter (Signed)
Patient aware of results.

## 2023-04-26 ENCOUNTER — Other Ambulatory Visit: Payer: Self-pay

## 2023-04-26 ENCOUNTER — Ambulatory Visit: Payer: BLUE CROSS/BLUE SHIELD | Attending: Surgery | Admitting: Surgery

## 2023-04-26 VITALS — BP 136/85 | HR 102 | Temp 98.1°F | Resp 18

## 2023-04-26 DIAGNOSIS — J209 Acute bronchitis, unspecified: Secondary | ICD-10-CM | POA: Insufficient documentation

## 2023-04-26 DIAGNOSIS — J40 Bronchitis, not specified as acute or chronic: Secondary | ICD-10-CM

## 2023-04-26 DIAGNOSIS — Z1159 Encounter for screening for other viral diseases: Secondary | ICD-10-CM | POA: Insufficient documentation

## 2023-04-26 DIAGNOSIS — J029 Acute pharyngitis, unspecified: Secondary | ICD-10-CM | POA: Insufficient documentation

## 2023-04-26 DIAGNOSIS — Z1152 Encounter for screening for COVID-19: Secondary | ICD-10-CM | POA: Insufficient documentation

## 2023-04-26 DIAGNOSIS — R0981 Nasal congestion: Secondary | ICD-10-CM | POA: Insufficient documentation

## 2023-04-26 LAB — COVID/INFLUENZA A & B/RSV NAAT (PCR)
COVID-19 NAAT (PCR): NEGATIVE
Influenza A NAAT (PCR): NEGATIVE
Influenza B NAAT (PCR): NEGATIVE
RSV NAAT (PCR): NEGATIVE

## 2023-04-26 MED ORDER — PREDNISONE 50 MG PO TABS *I*
50.0000 mg | ORAL_TABLET | Freq: Every day | ORAL | 0 refills | Status: DC
Start: 2023-04-26 — End: 2023-05-14

## 2023-04-26 MED ORDER — AMOXICILLIN-POT CLAVULANATE 875-125 MG PO TABS *I*
1.0000 | ORAL_TABLET | Freq: Two times a day (BID) | ORAL | 0 refills | Status: DC
Start: 2023-04-26 — End: 2023-04-29

## 2023-04-26 MED ORDER — ALBUTEROL SULFATE HFA 108 (90 BASE) MCG/ACT IN AERS *I*
1.0000 | INHALATION_SPRAY | Freq: Four times a day (QID) | RESPIRATORY_TRACT | 0 refills | Status: AC | PRN
Start: 2023-04-26 — End: ?

## 2023-04-26 MED ORDER — BENZONATATE 200 MG PO CAPS *I*
200.0000 mg | ORAL_CAPSULE | Freq: Three times a day (TID) | ORAL | 0 refills | Status: DC | PRN
Start: 2023-04-26 — End: 2023-05-14

## 2023-04-26 NOTE — UC Provider Note (Signed)
History     Chief Complaint   Patient presents with    Cough     Amanda Phelps is a 32 year old female with a history of PE, Hashimoto's disorder, anxiety disorder, migraine headaches presenting to urgent care with complaints of 4-day history of cough, cold and congestion.  Patient reports she has pharyngitis in the lower aspect of her throat and discomfort with swallowing.  She reports significant chills last night but denies any fevers.  She complains of exertional dyspnea and new wheezing.  Denies any history of asthma or nicotine dependence.  She has tried over-the-counter cough and cold medication but states she has not found it helpful.  She was exposed to her son who was very sick for a week.  Patient did not do rapid at home COVID test.        Medical/Surgical/Family History     Past Medical History:   Diagnosis Date    ADD (attention deficit disorder)     not formerly diagnosed    Anxiety disorder     Hashimoto's disease         Patient Active Problem List   Diagnosis Code    Breast asymmetry in female N49.89    Viral URI with cough J06.9    Anxiety disorder F41.9    Migraine G43.909            Past Surgical History:   Procedure Laterality Date    TONSILLECTOMY AND ADENOIDECTOMY       Family History   Problem Relation Age of Onset    Cancer Maternal Grandfather         Lung cancer, smoker    Heart failure Maternal Grandfather     Thyroid disease Maternal Grandfather     Heart failure Maternal Grandmother     Diabetes Maternal Grandmother     Anemia Maternal Grandmother     COPD Maternal Grandmother     Osteoporosis Maternal Aunt     Depression Maternal Aunt     Thyroid disease Mother     Diabetes Mother     COPD Mother     Rheum arthritis Mother     Diverticulitis Father           Social History     Tobacco Use    Smoking status: Never    Smokeless tobacco: Never   Substance Use Topics    Alcohol use: Yes     Comment: socially     Drug use: Not Currently     Types: Marijuana     Comment: occasional      Living Situation       Questions Responses    Patient lives with Spouse    Homeless     Caregiver for other family member     External Services     Employment     Domestic Violence Risk                   Review of Systems   Review of Systems   Constitutional: Negative.    HENT:  Positive for congestion and sore throat.    Eyes: Negative.    Respiratory:  Positive for cough, shortness of breath and wheezing.    Cardiovascular: Negative.    Gastrointestinal: Negative.    Genitourinary: Negative.    Musculoskeletal: Negative.    Skin: Negative.    Neurological: Negative.        Physical Exam   Vitals     First  Recorded BP: 136/85, Resp: 18, Temp: 36.7 C (98.1 F) Oxygen Therapy SpO2: 97 %, Heart Rate: 102, (04/26/23 0813)  .      Physical Exam  Vitals and nursing note reviewed.   Constitutional:       Appearance: Normal appearance. She is obese.   HENT:      Head: Normocephalic and atraumatic.      Nose: Nose normal.      Mouth/Throat:      Mouth: Mucous membranes are moist.      Pharynx: Oropharynx is clear.   Eyes:      Extraocular Movements: Extraocular movements intact.      Conjunctiva/sclera: Conjunctivae normal.      Pupils: Pupils are equal, round, and reactive to light.   Cardiovascular:      Rate and Rhythm: Normal rate and regular rhythm.      Pulses: Normal pulses.      Heart sounds: Normal heart sounds.   Pulmonary:      Effort: Pulmonary effort is normal.      Breath sounds: Wheezing present.   Musculoskeletal:         General: Normal range of motion.      Cervical back: Normal range of motion and neck supple.   Skin:     General: Skin is warm.      Capillary Refill: Capillary refill takes less than 2 seconds.   Neurological:      General: No focal deficit present.      Mental Status: She is alert and oriented to person, place, and time. Mental status is at baseline.          Medical Decision Making   Medical Decision Making  Assessment:    Amanda Phelps is a 32 year old female with cough,  congestion, dyspnea and pharyngitis.  On exam found to have expiratory wheezes throughout.  Normal posterior oropharyngeal exam with no evidence of erythema or exudates.  Vitally stable without hypoxia or fever    Differential diagnosis:    Bronchitis  Viral illness  URI    Plan and Results:    Nasal viral swab obtained  Z-Pak  Prednisone x 1 week  Albuterol inhaler as needed  Tessalon Perles as needed        Diagnosis and Disposition:   Amanda Phelps is a 32 year old female with acute bronchitis, concern for bacterial etiology given fevers, chills and duration.  Nasal viral swab obtained with results pending.  Prescription for Z-Pak, prednisone, albuterol inhaler and Tessalon Perles provided.  Counseled additionally on supportive measures.  Return precautions discussed.  Advised follow-up PCP.      Final Diagnosis    ICD-10-CM ICD-9-CM   1. Bronchitis  J40 [id number]         Leeroy Cha, NP          Author:  Leeroy Cha, NP

## 2023-04-26 NOTE — Patient Instructions (Signed)
-  You have been diagnosed today with bronchitis, this is a viral illness and is self-limiting  -Take prednisone daily in the morning with food until completion  -Use albuterol inhaler 1 to 2 puffs as needed every 4-6 hours for wheezing and shortness of breath  -Take Tessalon Perles up to 3 times a day as needed for cough  -Take Tylenol and Motrin as needed, drink warm fluids, throat lozenges, stay hydrated and rested  -follow-up with your PCP accordingly and return if any worsening symptoms or no clinical improvement

## 2023-04-26 NOTE — Nursing Note (Signed)
Patient presents to UC with a cough, body chills, fatigue, pain in neck. Symptoms started Saturday.

## 2023-04-28 ENCOUNTER — Encounter: Payer: Self-pay | Admitting: Primary Care

## 2023-04-28 ENCOUNTER — Ambulatory Visit
Admission: RE | Admit: 2023-04-28 | Discharge: 2023-04-28 | Disposition: A | Discharge status: Home / Self Care | Payer: BLUE CROSS/BLUE SHIELD | Source: Ambulatory Visit | Attending: Primary Care | Admitting: Primary Care

## 2023-04-28 ENCOUNTER — Ambulatory Visit: Payer: BLUE CROSS/BLUE SHIELD | Attending: Primary Care | Admitting: Primary Care

## 2023-04-28 ENCOUNTER — Ambulatory Visit: Payer: BLUE CROSS/BLUE SHIELD

## 2023-04-28 ENCOUNTER — Telehealth: Payer: Self-pay | Admitting: Primary Care

## 2023-04-28 ENCOUNTER — Other Ambulatory Visit: Payer: Self-pay

## 2023-04-28 VITALS — BP 120/90 | HR 117 | Temp 96.2°F | Wt 248.2 lb

## 2023-04-28 DIAGNOSIS — J45909 Unspecified asthma, uncomplicated: Secondary | ICD-10-CM | POA: Insufficient documentation

## 2023-04-28 DIAGNOSIS — R918 Other nonspecific abnormal finding of lung field: Secondary | ICD-10-CM

## 2023-04-28 DIAGNOSIS — R0602 Shortness of breath: Secondary | ICD-10-CM

## 2023-04-28 DIAGNOSIS — J22 Unspecified acute lower respiratory infection: Secondary | ICD-10-CM | POA: Insufficient documentation

## 2023-04-28 DIAGNOSIS — R059 Cough, unspecified: Secondary | ICD-10-CM

## 2023-04-28 NOTE — Progress Notes (Signed)
CC:   Chief Complaint   Patient presents with    Follow-up       HPI: Amanda Phelps is a 32 y.o. female who presented today for follow up visit form Urgent care. She states on Saturday she started not feeling well. From Saturday to Monday her symptoms started becoming worse. On Monday she had Dyspnea, pain with breathing and coughing. She went to urgent care where they tested her for COVID, flu, and RSV all came back negative. She was prescribed Augmentin, prednisone, tessalon pearls, and albuterol inhaler. She states she has not had any improvements since starting medications. She states that she also has been having hot and cold chills last night she had a temp. 102.74F. She is taking OTC acetaminophen total of 1000 mg for fever. Which has helped with her temp. She also has been taking the Albuterol inhaler every 4-5 hours. No other complaints voiced.    Review of Systems:     Please see HPI    Medications reviewed in eRecord today and confirmed with the patient with Yes changes being made  Medications:    Current Outpatient Medications:     amoxicillin-clavulanate (AUGMENTIN) 875-125 mg tablet, Take 1 tablet by mouth every 12 hours for 7 days for Infection of the Respiratory Tract., Disp: 14 tablet, Rfl: 0    predniSONE (DELTASONE) 50 mg tablet, Take 1 tablet (50 mg total) by mouth daily., Disp: 7 tablet, Rfl: 0    benzonatate (TESSALON) 200 mg capsule, Take 1 capsule (200 mg total) by mouth 3 times daily as needed for Cough., Disp: 30 capsule, Rfl: 0    levothyroxine (SYNTHROID, LEVOTHROID) 25 mcg tablet, Take 1 tablet (25 mcg total) by mouth daily (before breakfast)., Disp: 90 tablet, Rfl: 1    atomoxetine (STRATTERA) 40 mg capsule, Take 1 capsule (40 mg total) by mouth daily for Attention Deficit Hyperactivity Disorder., Disp: 30 capsule, Rfl: 5    fluticasone (FLONASE) 50 MCG/ACT nasal spray, Spray 1 spray into nostril daily, Disp: 16 g, Rfl: 5    valACYclovir (VALTREX) 1 gm tablet, Take 1 tablet (1,000 mg  total) by mouth 2 times daily, Disp: 10 tablet, Rfl: 0    FLUoxetine (PROZAC) 20 mg capsule, Take 1 capsule (20 mg total) by mouth daily, Disp: 30 capsule, Rfl: 2    levonorgestrel, MIRENA, (MIRENA, 52 MG,) 20 MCG/24HR IUD, Mirena 20 mcg/24 hours (7 yrs) 52 mg intrauterine device  Take by intrauterine route., Disp: , Rfl:     albuterol HFA (PROVENTIL, VENTOLIN) 108 (90 Base) MCG/ACT inhaler, Inhale 1-2 puffs into the lungs every 6 hours as needed for Wheezing. Shake well before each use., Disp: 1 each, Rfl: 0    metroNIDAZOLE (METROCREAM) 0.75 % cream, Apply topically daily. to the following areas: Face until clear, Disp: 45 g, Rfl: 3    mometasone (ELOCON) 0.1 % cream, Apply topically daily. as a thin film to the following areas: affected areas, Disp: 15 g, Rfl: 1    albuterol HFA (PROVENTIL, VENTOLIN, PROAIR HFA) 108 (90 Base) MCG/ACT inhaler, INHALE 1-2 PUFFS EVERY 6 HOURS AS NEEDED FOR WHEEZING FOR SPASM OF LUNG AIR PASSAGES SHAKE WELL BEFORE EACH USE, Disp: 8.5 g, Rfl: 3    Allergies:  Allergy History as of 04/28/23       AZITHROMYCIN         Noted Status Severity Type Reaction    07/25/12 1300 Boxx, [name] 07/25/12 Active Low Allergy Rash  SULFAMETHOXAZOLE-TRIMETHOPRIM         Noted Status Severity Type Reaction    07/25/12 1300 Boxx, [name] 07/25/12 Active Low Allergy Rash              CONTRAST DYE         Noted Status Severity Type Reaction    09/02/21 2217 Karsten Ro, RN 09/02/21 Active High Allergy Anaphylaxis                    Family History:  Family History   Problem Relation Age of Onset    Cancer Maternal Grandfather         Lung cancer, smoker    Heart failure Maternal Grandfather     Thyroid disease Maternal Grandfather     Heart failure Maternal Grandmother     Diabetes Maternal Grandmother     Anemia Maternal Grandmother     COPD Maternal Grandmother     Osteoporosis Maternal Aunt     Depression Maternal Aunt     Thyroid disease Mother     Diabetes Mother     COPD Mother      Rheum arthritis Mother     Diverticulitis Father        Social History:  Social History     Socioeconomic History    Marital status: Significant Other    Number of children: 1    Years of education: Designer, industrial/product   Occupational History    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     Comment: IUD palced 2019   Social History Narrative    Psychosocial Evaluation:        Lives with roommate and parents    Abuse: No    Depression symptoms:  no    School:  [employer] [place]    Patient is doing well in school:  yes    Grades:  Teaching laboratory technician or learning problems:  no    Peer relationships:  Good friends and boyfriend treats her well.    Family relationships:  Good    Future goals:  Marketing rep                 Vital Signs:  Vitals:    04/28/23 1406   BP: 120/90   Pulse: (!) 117   Temp: 35.7 C (96.2 F)   Weight: 112.6 kg (248 lb 3.2 oz)     Last 4 Weights    04/28/23 1406   Weight: 112.6 kg (248 lb 3.2 oz)       Physical Exam:  General: Well developed/well nourished female ill appearing.  ENT: Ears clear tympanic membrane transparent, Cone of light not distorted. Oral cavity: moist and pink, throat no erythema or exudate. Turbinates dry with erythema and swelling.   Pulmonary: Lungs wheezes auscultated bilaterally with normal air entry, slight crackles noted in lower airways bilaterally With expiration.   Cardiovascular: S1/S2, tachycardic rate/rhythm, no murmur, rubs or gallops appreciated  Gastrointestinal: Abdomen soft, non-distended and non-tender in all 4 quadrants, bs+x4,  no masses or organomegaly palpated  Psychiatric: Mood and affect are good.  Appropriate groomed with good eye contact    Recent Lab  Work:  Recent Results (from the past 2 weeks)   Vitamin B12    Collection Time: 04/19/23 12:36 PM   Result Value Ref Range    Vitamin B12 561 232 - 1,245 pg/mL   Vitamin D    Collection Time: 04/19/23 12:36 PM   Result Value Ref Range    25-OH Vit Total 32 30 - 60 ng/mL   T4, free    Collection Time: 04/19/23 12:36 PM   Result Value Ref Range    Free T4 1.0 0.9 - 1.7 ng/dL   TSH    Collection Time: 04/19/23 12:36 PM   Result Value Ref Range    TSH 3.75 0.27 - 4.20 uIU/mL   Lipid Panel (Reflex to Direct  LDL if Triglycerides more than 400)    Collection Time: 04/19/23 12:36 PM   Result Value Ref Range    Cholesterol 174 mg/dL    Triglycerides 621 mg/dL    HDL 65 (H) 40 - 60 mg/dL    LDL Calculated 82 mg/dL    Non HDL Cholesterol 109 mg/dL    Chol/HDL Ratio 2.7    Comprehensive metabolic panel    Collection Time: 04/19/23 12:36 PM   Result Value Ref Range    Sodium 138 133 - 145 mmol/L    Potassium 4.2 3.3 - 4.6 mmol/L    Chloride 101 96 - 108 mmol/L    CO2 26 20 - 28 mmol/L    Anion Gap 11 7 - 16    UN 9 6 - 20 mg/dL    Creatinine 3.08 6.57 - 0.95 mg/dL    eGFR BY CREAT 846 *    Glucose 97 60 - 99 mg/dL    Calcium 9.3 8.8 - 96.2 mg/dL    Total Protein 7.5 6.3 - 7.7 g/dL    Albumin 4.5 3.5 - 5.2 g/dL    Bilirubin,Total 0.4 0.0 - 1.2 mg/dL    AST 17 0 - 35 U/L    ALT 14 0 - 35 U/L    Alk Phos 57 35 - 105 U/L   CBC and differential    Collection Time: 04/19/23 12:36 PM   Result Value Ref Range    WBC 7.7 3.5 - 11.0 THOU/uL    RBC 4.3 4.0 - 5.5 MIL/uL    Hemoglobin 13.1 11.2 - 16.0 g/dL    Hematocrit 38 34 - 49 %    MCV 89 75 - 100 fL    RDW 12.7 0.0 - 15.0 %    Platelets 252 150 - 450 THOU/uL    Seg Neut % 73.0 %    Neut # K/uL 5.7 1.5 - 6.5 THOU/uL    Lymph # K/uL 1.4 1.0 - 5.0 THOU/uL    Mono # K/uL 0.6 0.1 - 1.0 THOU/uL    Eos # K/uL 0.1 0.0 - 0.5 THOU/uL    Baso # K/uL 0.0 0.0 - 0.2 THOU/uL    Nucl RBC % 0.0 0.0 - 0.2 /100 WBC    Nucl RBC # K/uL 0.0 0.0 - 0.0 THOU/uL    IMM Granulocytes # 0.0 0.0 - 0.0 THOU/uL     IMM Granulocytes 0.4 %   COVID/Influenza A & B/RSV NAAT (PCR)    Collection Time: 04/26/23  8:37 AM   Result Value Ref Range    COVID-19 Source  Nasopharyngeal  COVID-19 NAAT (PCR) NEGATIVE NEG    Influenza A NAAT (PCR) NEGATIVE Negative    Influenza B NAAT (PCR) NEGATIVE Negative    RSV NAAT (PCR) NEGATIVE Negative          Assessment  1. Lower respiratory infection  *Chest standard frontal and lateral views    CANCELED: *Chest standard frontal and lateral views      2. Asthma, unspecified asthma severity, unspecified whether complicated, unspecified whether persistent               Plan:    Lower respiratory infection: Ordered Chest X-ray. Get chest x-ray today. Will follow up once results are back. Continue taking Augmentin and prednisone. Depending on results may change to different ABX and to tapering prednisone. Ordered nebulizer will be sent through mail. Start using once received with duo neb solution ever up to 4 times a day as needed for cough, wheezing or chest congestion. Note provided for work to be off till Monday 05/03/23. Take alternate between acetaminophen maximum daily dose of 3000 mg  and ibuprofen up to a maximum daily dose of 2400 mg for fever control or discomfort.  Follow up with office if symptoms do not improve beginning of next. If symptoms become worse over weekend go to urgent care instead of waiting for office to open.    Asthma, unspecified: Ordered nebulizer will be sent through mail. Start using once received with duo neb solution ever 4 times a day as needed. Follow up in office if no improvement or as needed    The student was personally supervised by me during the patient examination. I personally saw and evaluated the patient, provided the medical decision-making, and reviewed and verified the key elements of the student documentation. I have edited the student's note and confirm the findings and plan of care as documented.

## 2023-04-28 NOTE — Telephone Encounter (Signed)
URI/Cough      1. Significant trouble breathing (I.e. can not talk in full sentences)?  yes  GASPing for breath     Additional information:  When did your symptoms start? 04/26/2023  Have you taken a COVID test?yes  What date?04/26/2023  Wheezing?yes  Temperature greater than 100.4 F?no  Nasal congestion? yes  Sinus pressure?yes  Sinus drainage? yes  If yes, what color?  Sore throat ? yes   Difficulty swallowing? yes  Nausea? no  Vomiting? no  Diarrhea? no   If patient was positive for COVID, is the patient requesting an antiviral medication? no

## 2023-04-28 NOTE — Telephone Encounter (Signed)
Spoke with patient. She stated on Sunday she woke up SOB and coughing. Monday woke up worse and went to UC. Was tested for COVID, RSV and Flu and they all came back negative. Patient is feeling worse now. Fever last night and into this morning 103.0. Patient has been using albuterol, prednisone, Augmentin, and tessalon pearls. She states that it hurts to breathe.

## 2023-04-28 NOTE — Telephone Encounter (Signed)
Added as requested.

## 2023-04-28 NOTE — Telephone Encounter (Signed)
Patient aware. Please put patient on Amanda Phelps's schedule

## 2023-04-29 ENCOUNTER — Telehealth: Payer: Self-pay | Admitting: Primary Care

## 2023-04-29 MED ORDER — DOXYCYCLINE HYCLATE 100 MG PO CAPS *I*
100.0000 mg | ORAL_CAPSULE | Freq: Two times a day (BID) | ORAL | 0 refills | Status: AC
Start: 2023-04-29 — End: 2023-05-06

## 2023-04-29 NOTE — Telephone Encounter (Signed)
Patient is aware of results, agreeable to antibiotic switch.   Is aware to call if symptoms worsen or dont get any better.

## 2023-04-29 NOTE — Telephone Encounter (Signed)
Chest x-ray returned noting positive for pneumonia and left lower lobe.  Will plan to switch her antibiotic over to doxycycline 100 mg twice daily for the next 7 days.  Staff will call and notify.  Continue with nebulizer treatments as well as Tessalon Perles as needed.  Follow-up in office if symptoms worsen or as needed

## 2023-05-14 ENCOUNTER — Telehealth: Payer: Self-pay | Admitting: Primary Care

## 2023-05-14 ENCOUNTER — Encounter: Payer: Self-pay | Admitting: Primary Care

## 2023-05-14 ENCOUNTER — Other Ambulatory Visit: Payer: Self-pay

## 2023-05-14 ENCOUNTER — Ambulatory Visit: Payer: BLUE CROSS/BLUE SHIELD | Attending: Primary Care | Admitting: Primary Care

## 2023-05-14 VITALS — BP 125/71 | HR 82 | Temp 98.1°F | Ht 70.0 in | Wt 259.0 lb

## 2023-05-14 DIAGNOSIS — G43909 Migraine, unspecified, not intractable, without status migrainosus: Secondary | ICD-10-CM | POA: Insufficient documentation

## 2023-05-14 DIAGNOSIS — F419 Anxiety disorder, unspecified: Secondary | ICD-10-CM | POA: Insufficient documentation

## 2023-05-14 DIAGNOSIS — Z713 Dietary counseling and surveillance: Secondary | ICD-10-CM | POA: Insufficient documentation

## 2023-05-14 DIAGNOSIS — E669 Obesity, unspecified: Secondary | ICD-10-CM

## 2023-05-14 DIAGNOSIS — E559 Vitamin D deficiency, unspecified: Secondary | ICD-10-CM | POA: Insufficient documentation

## 2023-05-14 DIAGNOSIS — E039 Hypothyroidism, unspecified: Secondary | ICD-10-CM | POA: Insufficient documentation

## 2023-05-14 DIAGNOSIS — E538 Deficiency of other specified B group vitamins: Secondary | ICD-10-CM | POA: Insufficient documentation

## 2023-05-14 DIAGNOSIS — J45909 Unspecified asthma, uncomplicated: Secondary | ICD-10-CM | POA: Insufficient documentation

## 2023-05-14 DIAGNOSIS — F909 Attention-deficit hyperactivity disorder, unspecified type: Secondary | ICD-10-CM | POA: Insufficient documentation

## 2023-05-14 DIAGNOSIS — Z0000 Encounter for general adult medical examination without abnormal findings: Secondary | ICD-10-CM | POA: Insufficient documentation

## 2023-05-14 NOTE — Telephone Encounter (Signed)
Patient skipped check out     Follow-up disposition: Follow up in about 6 weeks (around 06/25/2023) for Follow-up.   Check out comments: Pleasse also scheda 1 year Annual PE

## 2023-05-14 NOTE — Progress Notes (Signed)
CC: Weight loss counseling      HPI: Amanda Phelps is a 32 y.o. female who presented today for concerns of obesity and wanting to discuss possible weight loss medications.  Amanda Phelps states that she has been struggling with weight issues most of her life.  She notes that she exercises for at least 30 minutes with high impact or cardio 5 days a week.  She states that she is very strict with her diet at home and follows a healthy lifestyle.  She states that that she very rarely eats out and for the most part has cut most carbs as well as sweets out of her diet.  She states that she has been struggling with weight loss and stuck at the weight she is at at this point for quite some time.  She would like to know if there are any medications we would consider and specifically would like to talk about GLP-1's that she has heard about recently.  She denies any other concerns today.    Review of Systems:  See HPI      Medications reviewed in eRecord today and confirmed with the patient with No changes being made  Medications:    Current Outpatient Medications:     levothyroxine (SYNTHROID, LEVOTHROID) 25 mcg tablet, Take 1 tablet (25 mcg total) by mouth daily (before breakfast)., Disp: 90 tablet, Rfl: 1    metroNIDAZOLE (METROCREAM) 0.75 % cream, Apply topically daily. to the following areas: Face until clear, Disp: 45 g, Rfl: 3    valACYclovir (VALTREX) 1 gm tablet, Take 1 tablet (1,000 mg total) by mouth 2 times daily, Disp: 10 tablet, Rfl: 0    FLUoxetine (PROZAC) 20 mg capsule, Take 1 capsule (20 mg total) by mouth daily, Disp: 30 capsule, Rfl: 2    levonorgestrel, MIRENA, (MIRENA, 52 MG,) 20 MCG/24HR IUD, Mirena 20 mcg/24 hours (7 yrs) 52 mg intrauterine device  Take by intrauterine route., Disp: , Rfl:     albuterol HFA (PROVENTIL, VENTOLIN) 108 (90 Base) MCG/ACT inhaler, Inhale 1-2 puffs into the lungs every 6 hours as needed for Wheezing. Shake well before each use., Disp: 1 each, Rfl: 0    mometasone (ELOCON) 0.1 %  cream, Apply topically daily. as a thin film to the following areas: affected areas, Disp: 15 g, Rfl: 1    fluticasone (FLONASE) 50 MCG/ACT nasal spray, Spray 1 spray into nostril daily, Disp: 16 g, Rfl: 5    Allergies:  Allergy History as of 05/14/23       AZITHROMYCIN         Noted Status Severity Type Reaction    07/25/12 1300 Amanda Phelps 07/25/12 Active Low Allergy Rash              SULFAMETHOXAZOLE-TRIMETHOPRIM         Noted Status Severity Type Reaction    07/25/12 1300 Amanda Phelps 07/25/12 Active Low Allergy Rash              CONTRAST DYE         Noted Status Severity Type Reaction    09/02/21 2217 Amanda Ro, RN 09/02/21 Active High Allergy Anaphylaxis                    Family History:  Family History   Problem Relation Age of Onset    Cancer Maternal Grandfather         Lung cancer, smoker    Heart failure Maternal Grandfather  Thyroid disease Maternal Grandfather     Heart failure Maternal Grandmother     Diabetes Maternal Grandmother     Anemia Maternal Grandmother     COPD Maternal Grandmother     Osteoporosis Maternal Aunt     Depression Maternal Aunt     Thyroid disease Mother     Diabetes Mother     COPD Mother     Rheum arthritis Mother     Diverticulitis Father        Social History:  Social History     Socioeconomic History    Marital status: Significant Other    Number of children: 1    Years of education: Chief Operating Officer   Occupational History    Occupation: Lead Herbalist: [employer]   Tobacco Use    Smoking status: Never    Smokeless tobacco: Never   Substance and Sexual Activity    Alcohol use: Yes     Comment: socially     Drug use: Yes     Types: Marijuana     Comment: occasional    Sexual activity: Yes     Partners: Male     Birth control/protection: I.U.D.     Comment: IUD palced 2023   Social History Narrative    Psychosocial Evaluation:        Lives with roommate and parents    Abuse: No    Depression symptoms:  no    School:  [employer] [place]    Patient is  doing well in school:  yes    Grades:  Teaching laboratory technician or learning problems:  no    Peer relationships:  Good friends and boyfriend treats her well.    Family relationships:  Good    Future goals:  Marketing rep                 Vital Signs:  Vitals:    05/14/23 1402   BP: 125/71   Pulse: 82   Temp: 36.7 C (98.1 F)   Weight: 117.5 kg (259 lb)   Height: 1.778 m (5\' 10" )     Last 4 Weights    05/14/23 1402   Weight: 117.5 kg (259 lb)       Physical Exam:  General: Well developed/well nourished female in no acute distress   Pulmonary: Lungs clear to auscultation bilaterally with normal air entry, normal to percussion bilaterally, no adventitious breath sounds  Cardiovascular: S1/S2, regular rate/rhythm, no murmur appreciated  Gastrointestinal: Abdomen soft, non-distended and non-tender in all 4 quadrants, bs+x4, no masses or organomegaly palpated  Psychiatric: Mood and affect are good.  Appropriate groomed with good eye contact    Recent Lab Work:  No results found for this or any previous visit (from the past 2 weeks).       Assessment  Plan  1.  Obesity BMI 30.0-39.9-BMI in office visit today 37.16 with a weight of 259 pounds.  Currently at this point in time she has changed her diet at home and is eating a very healthy diet high-protein with lean proteins and high vegetables with decrease carbs and sweets.  She exercises 5 days a week with either high intensity impact training or cardio.  She notes she spends at least 30 minutes each of those days doing exercising.  She works from home so it is easy for her to be able to keep up with her exercise routine.  She notes that she has  tried several options for weight loss at home and none of them seem to be working.  We did discuss possible use of GLP-1 and risk versus benefits were reviewed with her.  I also discussed that some of these medications can be very expensive if not covered by her insurance.  She will plan to touch base with her insurance company  first to see if they are covered before we decide to move forward.  We discussed that there are other options also if these are not covered and we will discuss further at next office visit.  She will contact me when she speaks with her insurance company about the GLP-1's and we will decide where we will go with these medications.  Denies any other concerns today    2.  Weight loss counseling encounter for please see #1

## 2023-05-14 NOTE — Progress Notes (Signed)
CC:  annual physical    HPI: Amanda Phelps is a 32 y.o. female who presented today for routine annual physical exam.  Chronic health issues include migraines, anxiety disorder, B12 deficiency, hypothyroidism, asthma, adult ADHD, vitamin D deficiency.  She does state that she stopped the Strattera as she felt as though the medication caused side effects such as inability to sleep at night and nausea.  Once she stopped the medication she states that all symptoms resolved.  Overall she states things are going very well and denies any other concerns today.      Pap Smear: Up-to-date follows with OB/GYN  Immunizations: Reviewed need of immunizations today at this point in time she would like to hold on immunizations.  Versus benefits were reviewed and she states understanding.    Review of Systems:  General: Denies fever, chills, lightheadedness, dizziness, weight loss, night sweats  HEENT:  Denies runny nose, epistaxis, sinus pain, ear pain, tinnitus,  sore throat,   Pulmonary: Denies cough, sputum, wheezing, shortness of breath, dyspnea on exertion, hemoptysis   Cardiovascular: Denies chest pain, PND, orthopnea, palpitations,   Gastrointestinal: Denies upon abdominal pain, reflux, diarrhea or constipation.    Musculoskeletal: Denies pain, stiffness, joint swelling, or decreased range of motion  Lymphatic/Hematologic: Denies adenopathy , no excessive bleeding/bruising  Integumentary: Denies rash, itching, lesions  Neurologic: Denies numbness, weakness, balance changes,  seizures, headaches, or paresthesias  Psychiatric: Denies depression or anxiety.  Denies SI or HI.    Medications reviewed in eRecord today and confirmed with the patient with Yes changes being made  Medications:    Current Outpatient Medications:     levothyroxine (SYNTHROID, LEVOTHROID) 25 mcg tablet, Take 1 tablet (25 mcg total) by mouth daily (before breakfast)., Disp: 90 tablet, Rfl: 1    metroNIDAZOLE (METROCREAM) 0.75 % cream, Apply topically  daily. to the following areas: Face until clear, Disp: 45 g, Rfl: 3    valACYclovir (VALTREX) 1 gm tablet, Take 1 tablet (1,000 mg total) by mouth 2 times daily, Disp: 10 tablet, Rfl: 0    FLUoxetine (PROZAC) 20 mg capsule, Take 1 capsule (20 mg total) by mouth daily, Disp: 30 capsule, Rfl: 2    levonorgestrel, MIRENA, (MIRENA, 52 MG,) 20 MCG/24HR IUD, Mirena 20 mcg/24 hours (7 yrs) 52 mg intrauterine device  Take by intrauterine route., Disp: , Rfl:     albuterol HFA (PROVENTIL, VENTOLIN) 108 (90 Base) MCG/ACT inhaler, Inhale 1-2 puffs into the lungs every 6 hours as needed for Wheezing. Shake well before each use., Disp: 1 each, Rfl: 0    mometasone (ELOCON) 0.1 % cream, Apply topically daily. as a thin film to the following areas: affected areas, Disp: 15 g, Rfl: 1    fluticasone (FLONASE) 50 MCG/ACT nasal spray, Spray 1 spray into nostril daily, Disp: 16 g, Rfl: 5      Allergies:  Allergy History as of 05/14/23       AZITHROMYCIN         Noted Status Severity Type Reaction    07/25/12 1300 Boxx, [name] 07/25/12 Active Low Allergy Rash              SULFAMETHOXAZOLE-TRIMETHOPRIM         Noted Status Severity Type Reaction    07/25/12 1300 Boxx, [place] O 07/25/12 Active Low Allergy Rash              CONTRAST DYE         Noted Status Severity Type Reaction  09/02/21 2217 Karsten Ro, RN 09/02/21 Active High Allergy Anaphylaxis                    Family History:  Family History   Problem Relation Age of Onset    Cancer Maternal Grandfather         Lung cancer, smoker    Heart failure Maternal Grandfather     Thyroid disease Maternal Grandfather     Heart failure Maternal Grandmother     Diabetes Maternal Grandmother     Anemia Maternal Grandmother     COPD Maternal Grandmother     Osteoporosis Maternal Aunt     Depression Maternal Aunt     Thyroid disease Mother     Diabetes Mother     COPD Mother     Rheum arthritis Mother     Diverticulitis Father        Social History:  Social History     Socioeconomic  History    Marital status: Significant Other    Number of children: 1    Years of education: Chief Operating Officer   Occupational History    Occupation: Lead Herbalist: [employer]   Tobacco Use    Smoking status: Never    Smokeless tobacco: Never   Substance and Sexual Activity    Alcohol use: Yes     Comment: socially     Drug use: Yes     Types: Marijuana     Comment: occasional    Sexual activity: Yes     Partners: Male     Birth control/protection: I.U.D.     Comment: IUD palced 2023   Social History Narrative    Psychosocial Evaluation:        Lives with roommate and parents    Abuse: No    Depression symptoms:  no    School:  [employer] [place]    Patient is doing well in school:  yes    Grades:  Teaching laboratory technician or learning problems:  no    Peer relationships:  Good friends and boyfriend treats her well.    Family relationships:  Good    Future goals:  Marketing rep                 Vital Signs:  Vitals:    05/14/23 1402   BP: 125/71   Pulse: 82   Temp: 36.7 C (98.1 F)   Weight: 117.5 kg (259 lb)   Height: 1.778 m (5\' 10" )     Last 4 Weights    05/14/23 1402   Weight: 117.5 kg (259 lb)       Physical Exam:  General: Well developed/well nourished female in no acute distress   ENT: PERRLA, sclera clear, conjunctiva pink TMs normal bilaterally, moist mucus membranes,Turbinates without erythema or edema, pharynx without exudate erythema or edema, good dentition.  Neck: Supple, no thyromegaly appreciated, no JVD or Bruits  Pulmonary: Lungs clear to auscultation bilaterally with normal air entry, normal to percussion bilaterally, no adventitious breath sounds  Cardiovascular: S1/S2, regular rate/rhythm, no murmur, rubs or gallops appreciated  Gastrointestinal: Abdomen soft, non-distended and non-tender, BS+ in all 4 quadrants,  no masses or organomegaly palpated  Musculoskeletal: Range of motion intact with strength 5/5 in all extremities, spine is straight.  Gait is normal  Lymphatic: No anterior  cervical or mid clavicular adenopathy  Neurologic: Alert and oriented x3, cranial nerves II through XII are intact,  Romberg's negative, reflexes are symmetrical.  Psychiatric: Mood and affect are good.  Appropriate groomed with good eye contact    Recent Lab Work:  No results found for this or any previous visit (from the past 2 weeks).       Assessment  1. PE (physical exam), annual        2. Migraine        3. Anxiety disorder, unspecified type        4. B12 deficiency        5. Hypothyroidism, unspecified type        6. Asthma, unspecified asthma severity, unspecified whether complicated, unspecified whether persistent        7. Adult ADHD        8. Vitamin D deficiency        9. Obesity (BMI 30-39.9)        10. Weight loss counseling, encounter for                   Plan  1.  Physical exam annual-will plan lab work at next office visit.  She did want to speak about weight loss counseling today please see weight loss counseling note.  She did stop the Strattera so that Strattera was discontinued she would like to hold on starting anything until we determine where we are going to go with weight loss medications.  Will plan follow-up in office in about 6 weeks and as needed we will also schedule annual physical in 1 year    2.  Migraine-states well-controlled at this time denies any issues or concerns.  Continue to monitor plan to follow-up in office in 6 weeks and as needed    3.  Anxiety disorder unspecified-states well-controlled at this time.  Currently utilizing Prozac 20 mg p.o. daily.  Denies any SI or HI and states that she has a good support system surrounding her at home.  We will continue to monitor and plan to follow-up in office in 6 months and as needed    4.  B12 deficiency-resolved at this time continue to monitor    5.  Hypothyroidism unspecified-results within normal limits at this time.  Continue with Levothyroxine 25 mcg tablet 1 p.o. every morning before breakfast.  No changes.  Denies any cold  intolerance, hair loss or racing heart rate.  Follow-up in office in 6 months and as needed    6.  Asthma unspecified-states well-controlled at this time.  Occasional use of albuterol inhaler.  Denies any other concerns.  Follow-up as needed    7.  Adult ADHD-tried Strattera but unfortunately she had side effects.  Will hold on any medications at this point in time until we decide where we are going with weight loss medications.  Will plan follow-up in office in 6 weeks and as needed    8.  Vitamin D deficiency-resolved at this time continue to monitor    9.  Obesity-please see weight loss counseling visit note    10.  Weight loss counseling-please see weight loss visit counseling note.

## 2023-05-17 MED ORDER — TIRZEPATIDE-WEIGHT MANAGEMENT 2.5 MG/0.5ML SC SOAJ *A*
2.5000 mg | SUBCUTANEOUS | 1 refills | Status: DC
Start: 2023-05-17 — End: 2023-05-17

## 2023-05-17 MED ORDER — SEMAGLUTIDE-WEIGHT MANAGEMENT 0.25 MG/0.5ML SC SOAJ *I*
0.2500 mg | SUBCUTANEOUS | 0 refills | Status: DC
Start: 2023-05-17 — End: 2023-06-14

## 2023-05-17 NOTE — Telephone Encounter (Signed)
Prescription is sent to pharmacy for starting Zepbound 2.5 mg subcu once weekly injection for weight loss.

## 2023-05-17 NOTE — Addendum Note (Signed)
Addended by: Yolande Jolly on: 05/17/2023 10:11 AM     Modules accepted: Orders

## 2023-05-18 ENCOUNTER — Telehealth: Payer: Self-pay | Admitting: Primary Care

## 2023-05-18 NOTE — Telephone Encounter (Signed)
semaglutide for weight management [facility])     Patient called stating this medication needs a prior auth

## 2023-05-18 NOTE — Telephone Encounter (Signed)
Needs prior auth for weight loss management     WEGOVY

## 2023-05-20 NOTE — Telephone Encounter (Signed)
I'm waiting for her insurance to fax a form, as I still can't pull her up in our system.

## 2023-05-20 NOTE — Telephone Encounter (Signed)
Insurance is stating no coverage is found. Can she upload a picture of her insurance card with the rx bin, pcn and grp

## 2023-05-20 NOTE — Telephone Encounter (Signed)
Cover my meds key: [id number]

## 2023-05-24 NOTE — Telephone Encounter (Signed)
Any update on this?

## 2023-05-25 NOTE — Telephone Encounter (Signed)
I'm still waiting on her insurance to fax me over a form.

## 2023-05-28 NOTE — Telephone Encounter (Signed)
Thank you for the form! I submitted it this morning as urgent.

## 2023-05-28 NOTE — Telephone Encounter (Signed)
Amanda Phelps was denied:

## 2023-06-01 ENCOUNTER — Encounter: Payer: Self-pay | Admitting: Primary Care

## 2023-06-01 DIAGNOSIS — E669 Obesity, unspecified: Secondary | ICD-10-CM

## 2023-06-01 DIAGNOSIS — Z713 Dietary counseling and surveillance: Secondary | ICD-10-CM

## 2023-06-02 NOTE — Telephone Encounter (Signed)
I was able to get it approved with all of her receipts this morning.

## 2023-06-02 NOTE — Telephone Encounter (Signed)
Amanda Phelps was approved through 10.30.25

## 2023-06-17 ENCOUNTER — Ambulatory Visit: Payer: BLUE CROSS/BLUE SHIELD | Attending: Primary Care | Admitting: Primary Care

## 2023-06-17 ENCOUNTER — Other Ambulatory Visit: Payer: Self-pay

## 2023-06-17 ENCOUNTER — Encounter: Payer: Self-pay | Admitting: Primary Care

## 2023-06-17 VITALS — BP 120/83 | HR 85 | Temp 98.0°F | Wt 265.0 lb

## 2023-06-17 DIAGNOSIS — Z713 Dietary counseling and surveillance: Secondary | ICD-10-CM | POA: Insufficient documentation

## 2023-06-17 DIAGNOSIS — E669 Obesity, unspecified: Secondary | ICD-10-CM | POA: Insufficient documentation

## 2023-06-17 MED ORDER — SEMAGLUTIDE-WEIGHT MANAGEMENT 0.5 MG/0.5ML SC SOAJ *I*
0.5000 mg | SUBCUTANEOUS | 1 refills | Status: DC
Start: 2023-06-17 — End: 2023-06-17

## 2023-06-17 MED ORDER — SEMAGLUTIDE-WEIGHT MANAGEMENT 0.5 MG/0.5ML SC SOAJ *I*
0.5000 mg | SUBCUTANEOUS | 1 refills | Status: DC
Start: 2023-06-17 — End: 2023-07-20

## 2023-06-17 NOTE — Progress Notes (Signed)
CC:   Chief Complaint   Patient presents with    Follow-up       HPI: Amanda Phelps is a 32 y.o. female who presented today for follow up since starting wegovy. Overall she states she has not noticed much of a difference since starting the medication. Despite being on the medication and taking her third does on 06/16/2023 she has had an increase in weight of 6 pounds since starting the medication. She does exercise regularly 4-5 times a week. Her diet includes leans meats such as skinless boneless chicken, and leafy greens. We discussed adding other vegetables into her diet. She states there are times that after she eats a meal she still is hungry and can keep eating. She was encouraged when she feels that way to eat a snack that was low to no calories such as watermelon. She voiced that her biggest problem she feels is portion control. We discussed possible starting weight watchers. She has tried this in the past and had good results. Using weight watchers with the wegovy we are hopeful we will start to see a decrease in weight. She denies any abdominal pain discomfort, constipation, or diarrhea. She does note very mild nausea the day after her injection but it only for a very short while and then dissipates completely. No other concerns voiced.     Review of Systems:    See HPI      Medications reviewed in eRecord today and confirmed with the patient with Yes changes being made  Medications:    Current Outpatient Medications:     levothyroxine (SYNTHROID, LEVOTHROID) 25 mcg tablet, Take 1 tablet (25 mcg total) by mouth daily (before breakfast)., Disp: 90 tablet, Rfl: 1    metroNIDAZOLE (METROCREAM) 0.75 % cream, Apply topically daily. to the following areas: Face until clear, Disp: 45 g, Rfl: 3    valACYclovir (VALTREX) 1 gm tablet, Take 1 tablet (1,000 mg total) by mouth 2 times daily, Disp: 10 tablet, Rfl: 0    FLUoxetine (PROZAC) 20 mg capsule, Take 1 capsule (20 mg total) by mouth daily, Disp: 30 capsule, Rfl:  2    levonorgestrel, MIRENA, (MIRENA, 52 MG,) 20 MCG/24HR IUD, Mirena 20 mcg/24 hours (7 yrs) 52 mg intrauterine device  Take by intrauterine route., Disp: , Rfl:     semaglutide for weight management (WEGOVY) 0.5 MG/0.5ML auto-injector, Inject 0.5 mLs (0.5 mg total) into the skin every 7 days., Disp: 2 mL, Rfl: 1    albuterol HFA (PROVENTIL, VENTOLIN) 108 (90 Base) MCG/ACT inhaler, Inhale 1-2 puffs into the lungs every 6 hours as needed for Wheezing. Shake well before each use., Disp: 1 each, Rfl: 0    mometasone (ELOCON) 0.1 % cream, Apply topically daily. as a thin film to the following areas: affected areas, Disp: 15 g, Rfl: 1    fluticasone (FLONASE) 50 MCG/ACT nasal spray, Spray 1 spray into nostril daily, Disp: 16 g, Rfl: 5    Allergies:  Allergy History as of 06/17/23       AZITHROMYCIN         Noted Status Severity Type Reaction    07/25/12 1300 Boxx, [name] 07/25/12 Active Low Allergy Rash              SULFAMETHOXAZOLE-TRIMETHOPRIM         Noted Status Severity Type Reaction    07/25/12 1300 Boxx, [name] 07/25/12 Active Low Allergy Rash  CONTRAST DYE         Noted Status Severity Type Reaction    09/02/21 2217 Karsten Ro, RN 09/02/21 Active High Allergy Anaphylaxis                    Family History:  Family History   Problem Relation Age of Onset    Cancer Maternal Grandfather         Lung cancer, smoker    Heart failure Maternal Grandfather     Thyroid disease Maternal Grandfather     Heart failure Maternal Grandmother     Diabetes Maternal Grandmother     Anemia Maternal Grandmother     COPD Maternal Grandmother     Osteoporosis Maternal Aunt     Depression Maternal Aunt     Thyroid disease Mother     Diabetes Mother     COPD Mother     Rheum arthritis Mother     Diverticulitis Father        Social History:  Social History     Socioeconomic History    Marital status: Significant Other    Number of children: 1    Years of education: Chief Operating Officer   Occupational History    Occupation: Lead  Herbalist: [employer]   Tobacco Use    Smoking status: Never    Smokeless tobacco: Never   Substance and Sexual Activity    Alcohol use: Yes     Comment: socially     Drug use: Yes     Types: Marijuana     Comment: occasional    Sexual activity: Yes     Partners: Male     Birth control/protection: I.U.D.     Comment: IUD palced 2023   Social History Narrative    Psychosocial Evaluation:        Lives with roommate and parents    Abuse: No    Depression symptoms:  no    School:  [employer] [place]    Patient is doing well in school:  yes    Grades:  Teaching laboratory technician or learning problems:  no    Peer relationships:  Good friends and boyfriend treats her well.    Family relationships:  Good    Future goals:  Marketing rep                 Vital Signs:  Vitals:    06/17/23 0759   BP: 120/83   Pulse: 85   Temp: 36.7 C (98 F)   Weight: 120.2 kg (265 lb)     Last 4 Weights    06/17/23 0759   Weight: 120.2 kg (265 lb)       Physical Exam:  General: Well developed/well nourished female in no acute distress   Pulmonary: Lungs clear to auscultation bilaterally with normal air entry, normal to percussion bilaterally, no adventitious breath sounds  Cardiovascular: S1/S2, regular rate/rhythm, no murmur, rubs, or gallops appreciated  Gastrointestinal: Abdomen soft, non-distended and non-tender in all 4 quadrants, bs+x4, no masses or organomegaly palpated  Psychiatric: Mood and affect are good.  Appropriate groomed with good eye contact    Recent Lab Work:  No results found for this or any previous visit (from the past 2 weeks).       Assessment  1. Weight loss counseling, encounter for  semaglutide for weight management (WEGOVY) 0.5 MG/0.5ML auto-injector      2. Obesity (BMI 30-39.9)  semaglutide for weight management (WEGOVY) 0.5 MG/0.5ML auto-injector             Plan    Weight loss counseling, encounter for: We will plan to increase Wegovy to 0.5mg  subcutaneous injection once weekly with her next  refill. Continue with healthy diet and exercise. Consider trying weight watchers.Continue to work on portion control. Encouraged during meals to wait a few moments between each bite before having another one. Encouraged once she has a feeling of fullness to put food aside. Follow up in 4 weeks or sooner if needed.   The student was personally supervised by me during the patient examination. I personally saw and evaluated the patient, provided the medical decision-making, and reviewed and verified the key elements of the student documentation. I have edited the student's note and confirm the findings and plan of care as documented.       Obesity (BMI 30-39.9)- See number 1

## 2023-06-17 NOTE — Telephone Encounter (Signed)
semaglutide for weight management (WEGOVY) 0.5 MG/0.5ML auto-injector     [employer] in [employer]

## 2023-06-23 ENCOUNTER — Ambulatory Visit: Payer: BLUE CROSS/BLUE SHIELD

## 2023-07-14 ENCOUNTER — Encounter: Payer: Self-pay | Admitting: Primary Care

## 2023-07-14 ENCOUNTER — Ambulatory Visit: Payer: BLUE CROSS/BLUE SHIELD | Attending: Primary Care | Admitting: Primary Care

## 2023-07-14 ENCOUNTER — Other Ambulatory Visit: Payer: Self-pay

## 2023-07-14 VITALS — BP 115/80 | HR 93 | Temp 96.7°F | Wt 251.0 lb

## 2023-07-14 DIAGNOSIS — T50905A Adverse effect of unspecified drugs, medicaments and biological substances, initial encounter: Secondary | ICD-10-CM | POA: Insufficient documentation

## 2023-07-14 DIAGNOSIS — R634 Abnormal weight loss: Secondary | ICD-10-CM | POA: Insufficient documentation

## 2023-07-14 NOTE — Progress Notes (Signed)
CC:   Chief Complaint   Patient presents with    Follow-up       HPI: Amanda Phelps is a 32 y.o. female who presented today for weight loss follow-up.  She is currently utilizing Wegovy 0.5 mg subcu once weekly.  She notes overall she is feeling really good on the medication.  She states that is really suppressed the "food noise ".  She states that she is very impressed with the amount of weight she has been losing.  She continues to eat healthy at home and is continuing on daily exercise.  She is lost a total of 14 pounds since last office visit.  She denies any GI issues or constipation.  States overall she is doing well and denies any other concerns today.    Review of Systems:  See HPI      Medications reviewed in eRecord today and confirmed with the patient with No changes being made  Medications:    Current Outpatient Medications:     semaglutide for weight management (WEGOVY) 0.5 MG/0.5ML auto-injector, Inject 0.5 mLs (0.5 mg total) into the skin every 7 days., Disp: 2 mL, Rfl: 1    levothyroxine (SYNTHROID, LEVOTHROID) 25 mcg tablet, Take 1 tablet (25 mcg total) by mouth daily (before breakfast)., Disp: 90 tablet, Rfl: 1    metroNIDAZOLE (METROCREAM) 0.75 % cream, Apply topically daily. to the following areas: Face until clear, Disp: 45 g, Rfl: 3    fluticasone (FLONASE) 50 MCG/ACT nasal spray, Spray 1 spray into nostril daily, Disp: 16 g, Rfl: 5    valACYclovir (VALTREX) 1 gm tablet, Take 1 tablet (1,000 mg total) by mouth 2 times daily, Disp: 10 tablet, Rfl: 0    FLUoxetine (PROZAC) 20 mg capsule, Take 1 capsule (20 mg total) by mouth daily, Disp: 30 capsule, Rfl: 2    levonorgestrel, MIRENA, (MIRENA, 52 MG,) 20 MCG/24HR IUD, Mirena 20 mcg/24 hours (7 yrs) 52 mg intrauterine device  Take by intrauterine route., Disp: , Rfl:     albuterol HFA (PROVENTIL, VENTOLIN) 108 (90 Base) MCG/ACT inhaler, Inhale 1-2 puffs into the lungs every 6 hours as needed for Wheezing. Shake well before each use., Disp: 1 each,  Rfl: 0    mometasone (ELOCON) 0.1 % cream, Apply topically daily. as a thin film to the following areas: affected areas, Disp: 15 g, Rfl: 1    Allergies:  Allergy History as of 07/14/23       AZITHROMYCIN         Noted Status Severity Type Reaction    07/25/12 1300 Boxx, [name] 07/25/12 Active Low Allergy Rash              SULFAMETHOXAZOLE-TRIMETHOPRIM         Noted Status Severity Type Reaction    07/25/12 1300 Boxx, [name] 07/25/12 Active Low Allergy Rash              CONTRAST DYE         Noted Status Severity Type Reaction    09/02/21 2217 Karsten Ro, RN 09/02/21 Active High Allergy Anaphylaxis                    Family History:  Family History   Problem Relation Age of Onset    Cancer Maternal Grandfather         Lung cancer, smoker    Heart failure Maternal Grandfather     Thyroid disease Maternal Grandfather     Heart  failure Maternal Grandmother     Diabetes Maternal Grandmother     Anemia Maternal Grandmother     COPD Maternal Grandmother     Osteoporosis Maternal Aunt     Depression Maternal Aunt     Thyroid disease Mother     Diabetes Mother     COPD Mother     Rheum arthritis Mother     Diverticulitis Father        Social History:  Social History     Socioeconomic History    Marital status: Significant Other    Number of children: 1    Years of education: Chief Operating Officer   Occupational History    Occupation: Lead Herbalist: [employer]   Tobacco Use    Smoking status: Never    Smokeless tobacco: Never   Substance and Sexual Activity    Alcohol use: Yes     Comment: socially     Drug use: Yes     Types: Marijuana     Comment: occasional    Sexual activity: Yes     Partners: Male     Birth control/protection: I.U.D.     Comment: IUD palced 2023   Social History Narrative    Psychosocial Evaluation:        Lives with roommate and parents    Abuse: No    Depression symptoms:  no    School:  [employer] [place]    Patient is doing well in school:  yes    Grades:  Teaching laboratory technician  or learning problems:  no    Peer relationships:  Good friends and boyfriend treats her well.    Family relationships:  Good    Future goals:  Marketing rep                 Vital Signs:  Vitals:    07/14/23 0745   BP: 115/80   Pulse: 93   Temp: 35.9 C (96.7 F)   Weight: 113.9 kg (251 lb)     Last 4 Weights    07/14/23 0745   Weight: 113.9 kg (251 lb)       Physical Exam:  General: Well developed/well nourished female in no acute distress   Pulmonary: Lungs clear to auscultation bilaterally with normal air entry, normal to percussion bilaterally, no adventitious breath sounds  Cardiovascular: S1/S2, regular rate/rhythm, no murmur, rubs or gallops appreciated  Gastrointestinal: Abdomen soft, non-distended and non-tender in all 4 quadrants, bs+x4, no masses or organomegaly palpated  Psychiatric: Mood and affect are good.  Appropriate groomed with good eye contact    Recent Lab Work:  No results found for this or any previous visit (from the past 2 weeks).       Assessment  1. Weight loss due to medication               Plan  1.  Weight loss due to medication-will plan to continue with Wegovy at 0.5 mg subcu once weekly as long as insurance will cover at this level as she is losing adequate weight at this point in time.  She has not having any side effects.  Encouraged her to continue with healthy diet and exercise at home and we will plan follow-up in office in 8 weeks and as needed

## 2023-07-20 ENCOUNTER — Other Ambulatory Visit: Payer: Self-pay | Admitting: Primary Care

## 2023-07-20 DIAGNOSIS — E669 Obesity, unspecified: Secondary | ICD-10-CM

## 2023-07-20 DIAGNOSIS — Z713 Dietary counseling and surveillance: Secondary | ICD-10-CM

## 2023-08-06 ENCOUNTER — Encounter: Payer: Self-pay | Admitting: Primary Care

## 2023-08-06 MED ORDER — DOXYCYCLINE HYCLATE 100 MG PO TABS *I*: 100.0000 mg | ORAL_TABLET | Freq: Two times a day (BID) | ORAL | 0 refills | Status: DC

## 2023-08-18 ENCOUNTER — Telehealth: Payer: Self-pay | Admitting: Primary Care

## 2023-08-18 NOTE — Telephone Encounter (Signed)
 MEDICATION PRIOR AUTH REQUIRED    PA KEY: [id number]    MEDICATION: Wegovy    DOSAGE: 0.5MG /0.5ML    IS THIS A NEW DOSAGE?: (yes or no):       CLINICAL INDICATION FOR MEDICATION (diagnosis):   Obesity BMI 30-39.9

## 2023-08-20 NOTE — Telephone Encounter (Signed)
 Patient called back, she states she actually does have prime still and wants to double check that we have the correct numbers     Prime: [id number]

## 2023-08-20 NOTE — Telephone Encounter (Signed)
 Patient reached, she states that her insurance is still active. She did state that her prime [employer] is no longer working so if she needs to call her insurance to get that working again, she would like a call back.

## 2023-08-25 MED ORDER — SEMAGLUTIDE-WEIGHT MANAGEMENT 1 MG/0.5ML SC SOAJ *I*
1.0000 mg | SUBCUTANEOUS | 0 refills | Status: DC
Start: 2023-08-25 — End: 2023-09-22

## 2023-08-25 NOTE — Telephone Encounter (Signed)
 Patient is calling about this medication, and would like an update.

## 2023-08-25 NOTE — Addendum Note (Signed)
 Addended by: Yolande Jolly on: 08/25/2023 10:41 AM     Modules accepted: Orders

## 2023-08-25 NOTE — Telephone Encounter (Signed)
 Tried contacting pt, no answer. LVM with ext

## 2023-09-06 ENCOUNTER — Encounter: Payer: Self-pay | Admitting: Primary Care

## 2023-09-06 ENCOUNTER — Other Ambulatory Visit: Payer: Self-pay

## 2023-09-06 ENCOUNTER — Ambulatory Visit: Payer: BLUE CROSS/BLUE SHIELD | Attending: Primary Care | Admitting: Primary Care

## 2023-09-06 VITALS — BP 112/80 | HR 74 | Temp 97.4°F | Ht 70.0 in | Wt 242.1 lb

## 2023-09-06 DIAGNOSIS — T50905A Adverse effect of unspecified drugs, medicaments and biological substances, initial encounter: Secondary | ICD-10-CM | POA: Insufficient documentation

## 2023-09-06 DIAGNOSIS — F419 Anxiety disorder, unspecified: Secondary | ICD-10-CM | POA: Insufficient documentation

## 2023-09-06 DIAGNOSIS — E669 Obesity, unspecified: Secondary | ICD-10-CM | POA: Insufficient documentation

## 2023-09-06 DIAGNOSIS — R634 Abnormal weight loss: Secondary | ICD-10-CM | POA: Insufficient documentation

## 2023-09-06 DIAGNOSIS — F909 Attention-deficit hyperactivity disorder, unspecified type: Secondary | ICD-10-CM | POA: Insufficient documentation

## 2023-09-06 DIAGNOSIS — E039 Hypothyroidism, unspecified: Secondary | ICD-10-CM | POA: Insufficient documentation

## 2023-09-06 NOTE — Progress Notes (Signed)
CC:   Chief Complaint   Patient presents with    Follow-up     8wk FUV       HPI: Amanda Phelps is a 33 y.o. female who presented today for routine follow-up visit for weight loss.  Currently utilizing Wegovy 1 mg subcu once weekly.  Chronic health issues include B12 deficiency, hypothyroidism, asthma, anxiety disorder and ADHD.  She states overall things are going very well.  She states with the increased dosage she is had a decrease in her "food noise ".  She states that she is doing very well on this.  She notes that she did start seeing someone else who is also on their own weight loss journey.  They have set a goal for the 2 of them to be able to do a 5K together.  She states that her ADHD symptoms are well-controlled without medication  She notes anxiety and depression are well-controlled with use of Prozac.  She denies any SI or HI and states things are going very well.  No other concerns today.    weight loss due to medication-currently utilizing Wegovy 1 mg subcu once weekly    Hypothyroidism-currently utilizing levothyroxine 25 mcg tablet 1 p.o. every morning     Anxiety-currently utilizing Prozac 20 mg p.o. daily    ADHD-controlled well without medications.    Review of Systems:  See HPI      Medications reviewed in eRecord today and confirmed with the patient with No changes being made  Medications:    Current Outpatient Medications:     semaglutide for weight management (WEGOVY) 1 MG/0.5ML auto-injector, Inject 1 mg into the skin every 7 days for 28 days., Disp: 2 mL, Rfl: 0    levothyroxine (SYNTHROID, LEVOTHROID) 25 mcg tablet, Take 1 tablet (25 mcg total) by mouth daily (before breakfast)., Disp: 90 tablet, Rfl: 1    FLUoxetine (PROZAC) 20 mg capsule, Take 1 capsule (20 mg total) by mouth daily, Disp: 30 capsule, Rfl: 2    levonorgestrel, MIRENA, (MIRENA, 52 MG,) 20 MCG/24HR IUD, Mirena 20 mcg/24 hours (7 yrs) 52 mg intrauterine device  Take by intrauterine route., Disp: , Rfl:     doxycycline  hyclate (VIBRA-TABS) 100 mg tablet, Take 1 tablet (100 mg total) by mouth every 12 hours for Skin Inflammation. (Patient not taking: Reported on 09/06/2023), Disp: 20 tablet, Rfl: 0    albuterol HFA (PROVENTIL, VENTOLIN) 108 (90 Base) MCG/ACT inhaler, Inhale 1-2 puffs into the lungs every 6 hours as needed for Wheezing. Shake well before each use., Disp: 1 each, Rfl: 0    metroNIDAZOLE (METROCREAM) 0.75 % cream, Apply topically daily. to the following areas: Face until clear, Disp: 45 g, Rfl: 3    mometasone (ELOCON) 0.1 % cream, Apply topically daily. as a thin film to the following areas: affected areas, Disp: 15 g, Rfl: 1    fluticasone (FLONASE) 50 MCG/ACT nasal spray, Spray 1 spray into nostril daily, Disp: 16 g, Rfl: 5    valACYclovir (VALTREX) 1 gm tablet, Take 1 tablet (1,000 mg total) by mouth 2 times daily, Disp: 10 tablet, Rfl: 0    Allergies:  Allergy History as of 09/06/23       AZITHROMYCIN         Noted Status Severity Type Reaction    07/25/12 1300 Boxx, [name] 07/25/12 Active Low Allergy Rash              SULFAMETHOXAZOLE-TRIMETHOPRIM  Noted Status Severity Type Reaction    07/25/12 1300 Boxx, [name] 07/25/12 Active Low Allergy Rash              CONTRAST DYE         Noted Status Severity Type Reaction    09/02/21 2217 Karsten Ro, RN 09/02/21 Active High Allergy Anaphylaxis                    Family History:  Family History   Problem Relation Age of Onset    Cancer Maternal Grandfather         Lung cancer, smoker    Heart failure Maternal Grandfather     Thyroid disease Maternal Grandfather     Heart failure Maternal Grandmother     Diabetes Maternal Grandmother     Anemia Maternal Grandmother     COPD Maternal Grandmother     Osteoporosis Maternal Aunt     Depression Maternal Aunt     Thyroid disease Mother     Diabetes Mother     COPD Mother     Rheum arthritis Mother     Diverticulitis Father        Social History:  Social History     Socioeconomic History    Marital status:  Significant Other    Number of children: 1    Years of education: Chief Operating Officer   Occupational History    Occupation: Lead Herbalist: [employer]   Tobacco Use    Smoking status: Never    Smokeless tobacco: Never   Substance and Sexual Activity    Alcohol use: Yes     Comment: socially     Drug use: Yes     Types: Marijuana     Comment: occasional    Sexual activity: Yes     Partners: Male     Birth control/protection: I.U.D.     Comment: IUD palced 2023   Social History Narrative    Psychosocial Evaluation:        Lives with roommate and parents    Abuse: No    Depression symptoms:  no    School:  [employer] [place]    Patient is doing well in school:  yes    Grades:  Teaching laboratory technician or learning problems:  no    Peer relationships:  Good friends and boyfriend treats her well.    Family relationships:  Good    Future goals:  Marketing rep                 Vital Signs:  Vitals:    09/06/23 0849   BP: 112/80   Pulse: 74   Temp: 36.3 C (97.4 F)   Weight: 109.8 kg (242 lb 2 oz)   Height: 1.778 m (5\' 10" )     Last 4 Weights    09/06/23 0849   Weight: 109.8 kg (242 lb 2 oz)       Physical Exam:  General: Well developed/well nourished female in no acute distress   Pulmonary: Lungs clear to auscultation bilaterally with normal air entry, normal to percussion bilaterally, no adventitious breath sounds  Cardiovascular: S1/S2, regular rate/rhythm, no murmur, rubs or gallops appreciated  Gastrointestinal: Abdomen soft, non-distended and non-tender in all 4 quadrants, bs+x4, no masses or organomegaly palpated  Psychiatric: Mood and affect are good.  Appropriate groomed with good eye contact    Recent Lab Work:  No results found for  this or any previous visit (from the past 2 weeks).       Assessment  1. Weight loss due to medication        2. Obesity (BMI 30-39.9)        3. Anxiety disorder, unspecified type        4. Adult ADHD               Plan  1.  Weight loss due to medication-is down 9 pounds since  last office visit.  Encouraged to really start buckling down on diet and exercise program at home.  Goal at this point in time is to get down to between 202-220 pounds and due to the fact that we are only just a little over 20 pounds from that goal I have encouraged her to really start toning exercises at home to help with the loose skin as well as moisturizing after every shower.  Will see how things are going in about 8 weeks.  Follow-up in office in 8 weeks and as needed    2.  Obesity BMI 30.0-39.9-BMI at today's visit 34.74.  Please see #1    3.  Anxiety disorder unspecified-denies any concerns at this point in time.  No changes to medications continue with Prozac 20 mg p.o. daily.  Denies any SI or HI.  Denies need for counseling services and states things are going well overall.  Will plan follow-up in office in 8 weeks and as needed    4.  Adult ADHD-controlled well without use of medications.  Continue to monitor.

## 2023-09-08 ENCOUNTER — Ambulatory Visit: Payer: BLUE CROSS/BLUE SHIELD

## 2023-09-21 ENCOUNTER — Other Ambulatory Visit: Payer: Self-pay | Admitting: Primary Care

## 2023-09-22 MED ORDER — SEMAGLUTIDE-WEIGHT MANAGEMENT 1.7 MG/0.75ML SC SOAJ *A*
1.7000 mg | SUBCUTANEOUS | 0 refills | Status: DC
Start: 2023-09-22 — End: 2023-10-19

## 2023-09-22 NOTE — Addendum Note (Signed)
 Addended by: Yolande Jolly on: 09/22/2023 10:28 AM     Modules accepted: Orders

## 2023-09-22 NOTE — Telephone Encounter (Signed)
 Referral sent to Pharm for increasing Wegovy to 1.7 mg subcu once weekly.

## 2023-10-06 ENCOUNTER — Ambulatory Visit: Payer: BLUE CROSS/BLUE SHIELD

## 2023-10-06 ENCOUNTER — Other Ambulatory Visit
Admission: RE | Admit: 2023-10-06 | Discharge: 2023-10-06 | Disposition: A | Discharge status: Home / Self Care | Source: Ambulatory Visit | Attending: Primary Care | Admitting: Primary Care

## 2023-10-06 ENCOUNTER — Other Ambulatory Visit: Payer: Self-pay

## 2023-10-06 VITALS — BP 110/80 | Ht 70.0 in | Wt 232.0 lb

## 2023-10-06 DIAGNOSIS — T50905A Adverse effect of unspecified drugs, medicaments and biological substances, initial encounter: Secondary | ICD-10-CM | POA: Insufficient documentation

## 2023-10-06 DIAGNOSIS — R634 Abnormal weight loss: Secondary | ICD-10-CM | POA: Insufficient documentation

## 2023-10-06 DIAGNOSIS — F419 Anxiety disorder, unspecified: Secondary | ICD-10-CM | POA: Insufficient documentation

## 2023-10-06 DIAGNOSIS — E039 Hypothyroidism, unspecified: Secondary | ICD-10-CM | POA: Insufficient documentation

## 2023-10-06 DIAGNOSIS — Z01419 Encounter for gynecological examination (general) (routine) without abnormal findings: Secondary | ICD-10-CM | POA: Insufficient documentation

## 2023-10-06 DIAGNOSIS — E669 Obesity, unspecified: Secondary | ICD-10-CM | POA: Insufficient documentation

## 2023-10-06 LAB — COMPREHENSIVE METABOLIC PANEL
ALT: 15 U/L (ref 0–35)
AST: 17 U/L (ref 0–35)
Albumin: 4.7 g/dL (ref 3.5–5.2)
Alk Phos: 55 U/L (ref 35–105)
Anion Gap: 12 (ref 7–16)
Bilirubin,Total: 0.5 mg/dL (ref 0.0–1.2)
CO2: 26 mmol/L (ref 20–28)
Calcium: 9.5 mg/dL (ref 8.8–10.2)
Chloride: 102 mmol/L (ref 96–108)
Creatinine: 0.68 mg/dL (ref 0.51–0.95)
Glucose: 92 mg/dL (ref 60–99)
Lab: 7 mg/dL (ref 6–20)
Potassium: 4.1 mmol/L (ref 3.3–4.6)
Sodium: 140 mmol/L (ref 133–145)
Total Protein: 7.5 g/dL (ref 6.3–7.7)
eGFR BY CREAT: 119 *

## 2023-10-06 LAB — LIPID PANEL
Chol/HDL Ratio: 3
Cholesterol: 165 mg/dL
HDL: 55 mg/dL (ref 40–60)
LDL Calculated: 96 mg/dL
Non HDL Cholesterol: 110 mg/dL
Triglycerides: 73 mg/dL

## 2023-10-06 LAB — VITAMIN B12: Vitamin B12: 862 pg/mL (ref 232–1245)

## 2023-10-06 LAB — T4, FREE: Free T4: 1.1 ng/dL (ref 0.9–1.7)

## 2023-10-06 LAB — VITAMIN D: 25-OH Vit Total: 30 ng/mL (ref 30–60)

## 2023-10-06 LAB — TSH: TSH: 2.25 u[IU]/mL (ref 0.27–4.20)

## 2023-10-06 NOTE — Progress Notes (Addendum)
 Subjective:     Amanda Phelps is a 33 y.o.  y.o.female who presents for an annual exam. The patient has no complaints today.  She reports she is interested in breast augmentation due to breast asymmetry.    GYN History  LMP:  09/30/23  Cycles are irregular without significant dysmenorrhea due to IUD.    Last pap smear: Date: 04/17/22 Results: no abnormalities  History of abnormal pap smear: No.   The patient is sexually active with new partner of 2 months.     STD History: none  Current contraception: IUD: Mirena    Patient's medications, allergies, past medical, surgical, social and family histories were reviewed and updated as appropriate.       Objective:      BP 110/80 (BP Location: Left arm, Patient Position: Sitting)   Ht 1.778 m (5\' 10" )   Wt 105.2 kg (232 lb)   LMP 09/30/2023 (Exact Date)   BMI 33.29 kg/m   General appearance: alert, appears stated age, and no distress  Neck: supple, symmetrical, trachea midline  Back: no tenderness to percussion or palpation  Lungs: clear to auscultation bilaterally  Breasts: normal appearance, no masses or tenderness,  Left breast noted to be slightly larger than right breast, reviewed monthly breast self examination  Heart: regular rate and rhythm  Abdomen: soft, non-tender; bowel sounds normal; no masses,  no organomegaly  Pelvic: cervix normal in appearance, external genitalia normal, no adnexal masses or tenderness, no cervical motion tenderness, uterus normal size, shape, and consistency, and vagina normal without discharge. IUD strings visualized. Pap and STI cultures obtained.  Extremities: extremities normal, atraumatic, no cyanosis or edema  Skin: Skin color, texture, turgor normal. No rashes or lesions        Assessment:      Presents for an annual exam.      Plan:      Reviewed ASCCP guidelines.  Pap was performed today and pending.    STI Cultures obtained and sent.  Self breast exam reviewed and encouraged.  Will consider options and providers in  [place] area for breast augmentation consultation.  STD  and condom use discussed.    Follow up with PCP for primary care concerns.  Follow up annually and as needed for gyn concerns.    Loreli Slot CNM

## 2023-10-07 NOTE — Result Encounter Note (Signed)
 Patient aware of results.

## 2023-10-08 LAB — N. GONORRHOEAE NAAT (PCR): N. gonorrhoeae NAAT (PCR): NEGATIVE

## 2023-10-08 LAB — TRICHOMONAS NAAT (PCR): Trichomonas NAAT (PCR): NEGATIVE

## 2023-10-08 LAB — CHLAMYDIA NAAT (PCR): Chlamydia NAAT (PCR): NEGATIVE

## 2023-10-14 ENCOUNTER — Telehealth: Payer: Self-pay | Admitting: Primary Care

## 2023-10-14 NOTE — Telephone Encounter (Signed)
 Called patient regarding their appointment on:    CALL OUTCOME: left message     Future Encounters      11/01/2023  9:20 AM Sch    FOLLOW UP VISIT    [facility]    Noniewicz, Catrina N, NP                This needs to be rescheduled because the provider is going to be unavailable on this day.

## 2023-10-14 NOTE — Telephone Encounter (Signed)
 Last read by Danice Goltz at  8:26 AM on 10/14/2023.

## 2023-10-18 LAB — GYN CYTOLOGY

## 2023-10-19 ENCOUNTER — Other Ambulatory Visit: Payer: Self-pay | Admitting: Primary Care

## 2023-11-01 ENCOUNTER — Other Ambulatory Visit: Payer: Self-pay | Admitting: Primary Care

## 2023-11-01 ENCOUNTER — Ambulatory Visit: Payer: BLUE CROSS/BLUE SHIELD | Admitting: Primary Care

## 2023-11-01 ENCOUNTER — Ambulatory Visit: Attending: Urgent Care | Admitting: Urgent Care

## 2023-11-01 ENCOUNTER — Other Ambulatory Visit: Payer: Self-pay

## 2023-11-01 VITALS — BP 110/72 | HR 89 | Temp 98.5°F | Resp 17

## 2023-11-01 DIAGNOSIS — H6692 Otitis media, unspecified, left ear: Secondary | ICD-10-CM | POA: Insufficient documentation

## 2023-11-01 DIAGNOSIS — E039 Hypothyroidism, unspecified: Secondary | ICD-10-CM

## 2023-11-01 MED ORDER — AMOXICILLIN-POT CLAVULANATE 875-125 MG PO TABS *I*
1.0000 | ORAL_TABLET | Freq: Two times a day (BID) | ORAL | 0 refills | Status: AC
Start: 2023-11-01 — End: 2023-11-08

## 2023-11-01 NOTE — UC Provider Note (Signed)
 History     Chief Complaint   Patient presents with    Sinusitis     Amanda Phelps is a 33 y.o. female who presents to [facility] with a complaint of cough, congestion, sore throat, sinus pressure, and ear pain for 4 days. She had fevers, as high as 103. She has been using Mucinex sinus and tylenol. Covid test at home was negative.         History provided by:  Patient      Medical/Surgical/Family History     Past Medical History:   Diagnosis Date    ADD (attention deficit disorder)     not formerly diagnosed    Anxiety disorder     Hashimoto's disease         Patient Active Problem List   Diagnosis Code    Breast asymmetry in female N43.89    Viral URI with cough J06.9    Anxiety disorder F41.9    Migraine G43.909            Past Surgical History:   Procedure Laterality Date    TONSILLECTOMY AND ADENOIDECTOMY       Family History   Problem Relation Age of Onset    Cancer Maternal Grandfather         Lung cancer, smoker    Heart failure Maternal Grandfather     Thyroid disease Maternal Grandfather     Heart failure Maternal Grandmother     Diabetes Maternal Grandmother     Anemia Maternal Grandmother     COPD Maternal Grandmother     Osteoporosis Maternal Aunt     Depression Maternal Aunt     Thyroid disease Mother     Diabetes Mother     COPD Mother     Rheum arthritis Mother     Diverticulitis Father           Social History[1]  Living Situation       Questions Responses    Patient lives with Spouse    Homeless     Caregiver for other family member     External Services     Employment     Domestic Violence Risk                   Review of Systems   Review of Systems   Constitutional:  Positive for chills, fatigue and fever.   HENT:  Positive for congestion, rhinorrhea, sinus pressure and sore throat.    Eyes: Negative.    Respiratory:  Positive for cough.    Cardiovascular: Negative.    Gastrointestinal: Negative.    Endocrine: Negative.    Genitourinary: Negative.    Musculoskeletal: Negative.    Skin:  Negative.    Allergic/Immunologic: Negative.    Neurological: Negative.    Hematological: Negative.    Psychiatric/Behavioral: Negative.         Physical Exam   Vitals     First Recorded BP: 110/72, Resp: 17, Temp: 36.9 C (98.5 F), Temp src: TEMPORAL Oxygen Therapy SpO2: 100 %, Heart Rate: 89, (11/01/23 0817)  .      Physical Exam     Medical Decision Making   Medical Decision Making  Assessment:    33 year old female Modena Jansky to the urgent care with complaint of cough, congestion, sore throat and bilateral ear pain for 4 days.  Right TM is erythematous and bulging on exam.    Differential diagnosis:    Bronchitis  Pneumonia  Sinusitis  Strep Pharyngitis  Mono   Otitis media  Otitis externa  Bronchiolitis   RAD  Viral Illness        Plan and Results:    Right TM erythematous and bulging. Starting Augmentin. Advised to finish entire antibiotic course as prescribed. Continue with rest, increasing fluids, and alternating Tylenol and Motrin as needed for fever or discomfort. Follow up with PCP in 2 weeks for ear recheck.   Encounter orders    Orders Placed This Encounter      amoxicillin-clavulanate (AUGMENTIN) 875-125 mg tablet        Independent Review of: chart/prior records    Diagnosis and Disposition:       Return precautions discussed and provided on AVS.      Final Diagnosis    ICD-10-CM ICD-9-CM   1. Otitis of left ear  H66.92 382.9         Linnell Fulling, NP          Author:  Linnell Fulling, NP               [1]   Social History  Tobacco Use    Smoking status: Never    Smokeless tobacco: Never   Substance Use Topics    Alcohol use: Yes     Comment: socially     Drug use: Yes     Types: Marijuana     Comment: occasional

## 2023-11-01 NOTE — Patient Instructions (Addendum)
 Right TM erythematous and bulging. Starting Augmentin. Advised to finish entire antibiotic course as prescribed. Continue with rest, increasing fluids, and alternating Tylenol and Motrin as needed for fever or discomfort. Follow up with PCP in 2 weeks for ear recheck.

## 2023-11-01 NOTE — Telephone Encounter (Signed)
 Last appt: 09/06/2023  Future appt:   Future Appointments   Date Time Provider Department Center   10/11/2024 10:00 AM Loreli Slot, CNM [facility] None

## 2023-11-01 NOTE — Nursing Note (Signed)
 Patient presents to UC with sinus pressure, congestion, SOB, bilateral ear pain, dizziness. Patient states symptoms started on Friday and she did have a fever.

## 2023-11-23 ENCOUNTER — Encounter: Payer: Self-pay | Admitting: Primary Care

## 2023-11-23 MED ORDER — SEMAGLUTIDE-WEIGHT MANAGEMENT 2.4 MG/0.75ML SC SOAJ *A*
2.4000 mg | SUBCUTANEOUS | 5 refills | Status: DC
Start: 2023-11-23 — End: 2024-06-01

## 2023-11-23 MED ORDER — SEMAGLUTIDE-WEIGHT MANAGEMENT 2.4 MG/0.75ML SC SOAJ *A*
2.4000 mg | SUBCUTANEOUS | 5 refills | Status: DC
Start: 2023-11-23 — End: 2023-11-23

## 2023-11-23 NOTE — Telephone Encounter (Signed)
 Date of next appointment: 0    Refill(s) Requested:     semaglutide  for weight management (WEGOVY ) 2.4 MG/0.75ML auto-injector       Pharmacy: [facility] - [address] [place]      [facility] does not have in stock    [phone]

## 2023-12-06 ENCOUNTER — Other Ambulatory Visit: Payer: Self-pay | Admitting: Primary Care

## 2023-12-06 MED ORDER — VALACYCLOVIR HCL 1000 MG PO TABS *I*
1000.0000 mg | ORAL_TABLET | Freq: Two times a day (BID) | ORAL | 0 refills | Status: AC
Start: 2023-12-06 — End: ?

## 2023-12-06 NOTE — Telephone Encounter (Signed)
 Refill request    Medication:     Confirm that the patient wants the medication sent to:   [facility] [phone] - [place], [place] - [address] AT [facility] [address]  [address]  [facility] [place] [address]  Phone: [phone] Fax: [phone]        Additional message: Pharmacy location confirmed with patient    Relevant labs:    Last Office Visit       Date Provider Department Visit Type Primary Dx    09/06/2023 Phebe Verdie SAILOR, NP [facility] Office Visit Weight loss due to medication            Last Telemedicine Visit    None       Last PA Office Visit    None          Next visit:            Additional information to be completed by clinical staff:  If the request is for a controlled substance please paste the iStop results below:      **If the refill protocol indicates they are due for labs please order them and tell the patient to have them drawn**

## 2024-01-26 ENCOUNTER — Ambulatory Visit

## 2024-01-26 ENCOUNTER — Other Ambulatory Visit: Payer: Self-pay

## 2024-01-26 VITALS — BP 105/75 | HR 72 | Temp 98.1°F | Resp 16

## 2024-01-26 DIAGNOSIS — B9689 Other specified bacterial agents as the cause of diseases classified elsewhere: Secondary | ICD-10-CM | POA: Insufficient documentation

## 2024-01-26 DIAGNOSIS — J019 Acute sinusitis, unspecified: Secondary | ICD-10-CM | POA: Insufficient documentation

## 2024-01-26 DIAGNOSIS — M940 Chondrocostal junction syndrome [Tietze]: Secondary | ICD-10-CM | POA: Insufficient documentation

## 2024-01-26 MED ORDER — AMOXICILLIN-POT CLAVULANATE 875-125 MG PO TABS *I*
1.0000 | ORAL_TABLET | Freq: Two times a day (BID) | ORAL | 0 refills | Status: AC
Start: 2024-01-26 — End: 2024-02-02

## 2024-01-26 MED ORDER — FLUTICASONE PROPIONATE 50 MCG/ACT NA SUSP *I*
1.0000 | Freq: Every day | NASAL | 0 refills | Status: AC
Start: 2024-01-26 — End: 2024-02-05

## 2024-01-26 MED ORDER — BENZONATATE 100 MG PO CAPS *I*
100.0000 mg | ORAL_CAPSULE | Freq: Three times a day (TID) | ORAL | 0 refills | Status: AC | PRN
Start: 2024-01-26 — End: 2024-02-05

## 2024-01-26 NOTE — Patient Instructions (Signed)
 Today you have been seen for and diagnosed with a sinusitis.  Attached below is some general information regarding sinusitis that can answer questions my patients commonly have.      Augmentin  twice daily sent to the pharmacy, take this medication as prescribed in its entirety.  Utilize daily allergy pill and Flonase . Follow-up with PCP if symptoms persist.  Return precautions discussed with patient and they are able to verbalize understanding.      What is sinusitis?  Sinusitis is a condition that can cause a stuffy nose, pain in the face, and discharge or mucus from the nose.  The sinuses are hollow areas in the bones of the face. They have a thin lining that normally makes a small amount of mucus. When this lining gets irritated or infected, it swells and makes extra mucus. This causes symptoms.  Sinusitis usually happens after a person gets sick with a cold. The germs causing the cold can infect the sinuses, too. Sometimes, other germs can be the cause of the infection. Often, a person feels like their cold is getting better. But then, they get sinusitis and begin to feel sick again.        What are the symptoms of sinusitis?  Common symptoms of sinusitis include:  ?Stuffy or blocked nose  ?Thick white, yellow, or green discharge from the nose  ?Pain in the teeth  ?Pain or pressure in the face - This often feels worse when a person bends forward.    People with sinusitis can also have other symptoms, such as:  ?Fever  ?Cough  ?Trouble smelling  ?Ear pressure or fullness  ?Headache  ?Bad breath  ?Feeling tired    Most of the time, symptoms start to improve in 7 to 10 days.    Should I see a doctor or nurse?  See your doctor or nurse if your symptoms last more than 10 days, or if your symptoms first get better but then get worse.  Rarely, sinusitis can lead to serious problems. See your doctor or nurse right away (do not wait 10 days) if you have:  ?Fever higher than 102F (38.9C)  ?Sudden and severe pain in  the face and head  ?Trouble seeing, or seeing double  ?Trouble thinking clearly  ?Swelling or redness around 1 or both eyes  ?Stiff neck    Is there anything I can do on my own to feel better?  Yes. To help with your symptoms, you can:  ?Take an over-the-counter pain reliever to reduce the pain.  ?Rinse your nose and sinuses with salt water a few times a day - Ask your doctor or nurse about the best way to do this.  ?Drink plenty of fluids - Staying hydrated might help to thin the mucus and make it drain more easily.    Your doctor might also recommend a steroid nose spray to reduce the swelling in your nose, especially if you have allergies. Talk to your doctor if you are thinking of using a steroid spray.    How is sinusitis treated?  Most of the time, sinusitis does not need to be treated with antibiotic medicines. This is because most sinusitis is caused by viruses, not bacteria, and antibiotics do not kill viruses. In fact, even sinusitis caused by bacteria will usually get better on its own without antibiotics.    Some people with sinusitis do need treatment with antibiotics. If your symptoms have not improved after 10 days, ask your doctor if you should  take antibiotics. They might recommend that you wait 1 more week to see if your symptoms improve. But if you have symptoms such as a fever or a lot of pain, they might prescribe antibiotics. If you do get antibiotics, follow all of your doctor's instructions about taking them.    What if my symptoms do not get better?  If your symptoms do not get better, talk with your doctor or nurse. They might order tests to figure out why you still have symptoms. These can include:  ?CT scan or other imaging tests - Imaging tests create pictures of the inside of the body.  ?A test to look inside the sinuses - For this test, a doctor puts a thin tube with a camera on the end into the nose and up into the sinuses.    Some people get a lot of sinus infections or have symptoms  that last at least 3 months. These people can have a different type of sinusitis called chronic sinusitis. Chronic sinusitis can be caused by different things. For example, some people have growths inside their nose or sinuses that are called polyps. Other people have allergies that cause their symptoms.

## 2024-01-26 NOTE — Nursing Note (Signed)
 Pt presents to UC with sinus congestion and pressure. Pt has ear pain as well. Pt states she's getting burning in her chest when she coughs. Pt states it started Tuesday last week.

## 2024-01-26 NOTE — UC Provider Note (Signed)
 History     Chief Complaint   Patient presents with    Nasal Congestion    Otalgia     33 year old female presenting to [facility] urgent care for complaint of sinus congestion, ear pain for over the past week and a half.  Patient endorses that she feels like she has a burning sensation in her chest when she is coughing.  She also endorses having some sinus pain and pressure feeling consistently.  She denies any chest pain at rest, chest pain with walking or any shortness of breath.  Patient reports that she has been taking some over-the-counter medications without any relief of symptoms.  She denies any difficulty breathing, difficulty swallowing, fevers, chills, nausea, vomiting.  Patient is able to speak in full and clear sentences and handle oral secretions.      History provided by:  Patient  Otalgia      Medical/Surgical/Family History     Past Medical History:   Diagnosis Date    ADD (attention deficit disorder)     not formerly diagnosed    Anxiety disorder     Hashimoto's disease         Patient Active Problem List   Diagnosis Code    Breast asymmetry in female N1.89    Viral URI with cough J06.9    Anxiety disorder F41.9    Migraine G43.909            Past Surgical History:   Procedure Laterality Date    TONSILLECTOMY AND ADENOIDECTOMY       Family History   Problem Relation Age of Onset    Cancer Maternal Grandfather         Lung cancer, smoker    Heart failure Maternal Grandfather     Thyroid  disease Maternal Grandfather     Heart failure Maternal Grandmother     Diabetes Maternal Grandmother     Anemia Maternal Grandmother     COPD Maternal Grandmother     Osteoporosis Maternal Aunt     Depression Maternal Aunt     Thyroid  disease Mother     Diabetes Mother     COPD Mother     Rheum arthritis Mother     Diverticulitis Father           Social History[1]  Living Situation       Questions Responses    Patient lives with Spouse    Homeless     Caregiver for other family member     External Services      Employment     Domestic Violence Risk                   Review of Systems   Review of Systems   HENT:  Positive for ear pain.        Physical Exam   Vitals     First Recorded BP: 105/75, Resp: 16, Temp: 36.7 C (98.1 F), Temp src: TEMPORAL Oxygen Therapy SpO2: 100 %, Heart Rate: 72, (01/26/24 1745)  .      Physical Exam     Medical Decision Making   Medical Decision Making  Assessment:    33 year old female presenting to [facility] urgent care for concern of congestion.  Vital signs and physical exam reassuring.  It is reassuring that this patient's chest pain is reproducible on exam and she only notes that with coughing.  No concern for ACS given chest pain is reproducible on exam  Differential diagnosis:    ABRS  Viral rhinosinusitis  Allergic rhinitis  Viral URI  Costochondritis    Plan and Results:    Augmentin  twice daily sent to the pharmacy, take this medication as prescribed in its entirety.  Utilize daily allergy pill and Flonase . Follow-up with PCP if symptoms persist.  Return precautions discussed with patient and they are able to verbalize understanding.   Tylenol  and ibuprofen  as needed.  Return if chest pain is persistent, you have shortness of breath, fevers, chills, nausea, vomiting.  Go to the ED for the symptoms.  Return precautions discussed with patient.  She is able to verbalize understanding these return precautions.        Diagnosis and Disposition:   ABRS.  Costochondritis.  Discharged home.      Final Diagnosis    ICD-10-CM ICD-9-CM   1. Costochondritis  M94.0 733.6   2. Acute bacterial sinusitis  J01.90 461.9    B96.89          Damien Mo, [place]          Author:  Damien Mo, PA               [1]   Social History  Tobacco Use    Smoking status: Never    Smokeless tobacco: Never   Substance Use Topics    Alcohol use: Yes     Comment: socially     Drug use: Yes     Types: Marijuana     Comment: occasional

## 2024-02-01 ENCOUNTER — Telehealth: Payer: Self-pay | Admitting: Primary Care

## 2024-02-01 MED ORDER — FLUCONAZOLE 150 MG PO TABS *I*
150.0000 mg | ORAL_TABLET | Freq: Once | ORAL | 0 refills | Status: AC
Start: 2024-02-01 — End: 2024-02-01

## 2024-02-01 NOTE — Telephone Encounter (Signed)
Patient is aware, verbalized understanding.

## 2024-02-01 NOTE — Telephone Encounter (Signed)
 Diflucan given patient recently on an antibiotic and now has a vaginal yeast infection.  If no improvement advised she will need an appointment

## 2024-02-01 NOTE — Addendum Note (Signed)
 Addended by: Roena Sassaman, KIM on: 02/01/2024 11:48 AM     Modules accepted: Orders

## 2024-02-01 NOTE — Telephone Encounter (Signed)
 Patient has called the office requesting a medication for vaginitis.    Patient was seen in the UC for a sinus infection and was provide a antibiotic and she now has a yeast infection      Please call and advise

## 2024-02-01 NOTE — Telephone Encounter (Signed)
 Amanda Phelps patient - started ABT 6/25 - now reporting symptoms of yeast infection - asking for diflucan to be sent in

## 2024-05-09 ENCOUNTER — Other Ambulatory Visit: Payer: Self-pay

## 2024-05-09 ENCOUNTER — Ambulatory Visit: Attending: Internal Medicine | Admitting: Internal Medicine

## 2024-05-09 VITALS — BP 132/74 | HR 84 | Temp 97.8°F | Resp 20

## 2024-05-09 DIAGNOSIS — R0981 Nasal congestion: Secondary | ICD-10-CM | POA: Insufficient documentation

## 2024-05-09 DIAGNOSIS — Z112 Encounter for screening for other bacterial diseases: Secondary | ICD-10-CM | POA: Insufficient documentation

## 2024-05-09 DIAGNOSIS — Z20822 Contact with and (suspected) exposure to covid-19: Secondary | ICD-10-CM | POA: Insufficient documentation

## 2024-05-09 DIAGNOSIS — Z1152 Encounter for screening for COVID-19: Secondary | ICD-10-CM | POA: Insufficient documentation

## 2024-05-09 DIAGNOSIS — J029 Acute pharyngitis, unspecified: Secondary | ICD-10-CM | POA: Insufficient documentation

## 2024-05-09 DIAGNOSIS — R059 Cough, unspecified: Secondary | ICD-10-CM | POA: Insufficient documentation

## 2024-05-09 DIAGNOSIS — Z1159 Encounter for screening for other viral diseases: Secondary | ICD-10-CM | POA: Insufficient documentation

## 2024-05-09 LAB — POCT AMBULATORY RAPID STREP
Lot #: 251164
Rapid Strep Group A Throat-POC: NEGATIVE

## 2024-05-09 MED ORDER — SALINE NASAL SPRAY 0.65 % NA SOLN *WRAPPED*
1.0000 | NASAL | 0 refills | Status: AC | PRN
Start: 2024-05-09 — End: ?

## 2024-05-09 MED ORDER — BENZONATATE 200 MG PO CAPS *I*
200.0000 mg | ORAL_CAPSULE | Freq: Three times a day (TID) | ORAL | 0 refills | Status: DC | PRN
Start: 2024-05-09 — End: 2024-06-06

## 2024-05-09 MED ORDER — PSEUDOEPHEDRINE HCL 30 MG PO TABS *I*
30.0000 mg | ORAL_TABLET | ORAL | 0 refills | Status: DC | PRN
Start: 2024-05-09 — End: 2024-06-06

## 2024-05-09 NOTE — Nursing Note (Signed)
 Reports having congestion 4 days ago, did take home covid test which was negative. Has cough.  Did have fever.  Does feel SOB.  Denies headache, ear pain.  Does have sore throat.

## 2024-05-09 NOTE — UC Provider Note (Signed)
 History Chief Complaint Patient presents with  Sore Throat History of Present IllnessThe patient is a 33 year old female who presents for evaluation of cold symptoms.She began experiencing symptoms of a cold on 05/05/2024, which she initially attributed to her seasonal allergies. Her condition worsened on 05/07/2024, prompting her to take a COVID-19 test on 05/08/2024, which returned negative. She has been self-medicating with sinus relief and NyQuil, but these have not provided significant relief. She has been taking her normal allergy medications. Her symptoms include congestion, sore throat, and cough, but she reports no ear pain. She has experienced a few episodes of fever, with the highest recorded temperature being 101 degrees. She does not use nasal saline. She reports no history of heart conditions or palpitations.MEDICATIONSCURRENT MEDS:NyQuilNondrowsy AntihistamineMedical/Surgical/Family History Past Medical History[1] Patient Active Problem List Diagnosis Code  Breast asymmetry in female N25.89  Viral URI with cough J06.9  Anxiety disorder F41.9  Migraine G43.909  Past Surgical History[2]Family History[3] Social History[4]Living Situation   Questions Responses  Patient lives with Spouse  Homeless   Caregiver for other family member   External Services   Employment   Domestic Violence Risk     Review of Systems Review of Systems Constitutional:  Positive for fatigue and fever. Negative for chills and diaphoresis. HENT:  Positive for congestion and sore throat. Negative for ear pain.  Eyes:  Negative for visual disturbance. Respiratory:  Positive for cough. Negative for shortness of breath.  Cardiovascular:  Negative for chest pain. Gastrointestinal:  Negative for abdominal pain, nausea and vomiting. Genitourinary:  Negative for dysuria. Musculoskeletal:  Negative for arthralgias, back  pain and neck pain. Skin:  Negative for color change and wound. Neurological:  Negative for light-headedness and headaches. Psychiatric/Behavioral:  Negative for confusion.  Physical Exam Vitals   First Recorded BP: 132/74, Resp: 20, Temp: 36.6 C (97.8 F), Temp src: TEMPORAL Oxygen Therapy SpO2: 97 %, Heart Rate: 84, (05/09/24 1746)  .Physical ExamPatient appears well and in no acute distress.Head is normocephalic and atraumatic. Conjunctivae are clear without discharge. Ear exam reveals normal canals and tympanic membranes bilaterally. Slight congestion noted on exam. No frontal sinus tenderness to palpation, slight maxillary sinus tender to palpation. Throat exam reveals no significant erythema, exudates or swelling. Patient has a double uvula which is midline.Lungs are clear to auscultation bilaterally. Medical Decision Making Patient is a 33 year old female who presents for evaluation for upper respiratory symptoms.  Vital signs within normal limits and O2 saturation is 97% on room air.Differential diagnosis:Otitis mediaAcute sinusitisPneumoniaBronchitisAllergic rhinitisViral illnessStrep pharyngitisOrders Placed This Encounter Procedures  COVID/Influenza A & B/RSV NAAT (PCR)  Parainfluenza 1-4 NAAT (PCR)  Meta/Rhino/Adeno virus NAAT (PCR)  Strep A culture, throat  POCT ambulatory rapid strep Recent Results (from the past 24 hours) POCT ambulatory rapid strep  Collection Time: 05/09/24  5:50 PM Result Value Ref Range  Rapid Strep Group A Throat-POC Negative for Streptococcus Group A Antigen Negative  INTERNAL CONTROL RAPID STREP POCT *Yes-internal procedural control(s) acceptable   Exp date 05/02/2025   Lot # [id number]   Assessment & Plan1. Cold symptoms.Her symptoms are likely due to a viral infection, as evidenced by the negative strep throat swab and pending viral swabs. There is no indication of an ear infection,  pneumonia, or bronchitis. She is advised to continue taking a nondrowsy antihistamine. A prescription for nasal saline will be sent to the pharmacy to help rinse out her sinuses. Additionally, prescriptions for Tessalon  Perles and Sudafed will be provided to manage her cough and congestion respectively.  She is instructed to keep Tessalon  Perles away from children due to its potential danger to them. If her symptoms do not improve after 7 days, she should return for a possible reevaluation and consideration of antibiotics for a sinus infection.Final Diagnosis  ICD-10-CM ICD-9-CM 1. Sore throat  J02.9 462 Morna Grout [place], PAAuthor:  Morna Grout Masters, [place] [1] Past Medical History:Diagnosis Date  ADD (attention deficit disorder)   not formerly diagnosed  Anxiety disorder   Hashimoto's disease  [2] Past Surgical History:Procedure Laterality Date  TONSILLECTOMY AND ADENOIDECTOMY   [3] Family HistoryProblem Relation Name Age of Onset  Cancer Maternal Grandfather        Lung cancer, smoker  Heart failure Maternal Grandfather    Thyroid  disease Maternal Grandfather    Heart failure Maternal Grandmother    Diabetes Maternal Grandmother    Anemia Maternal Grandmother    COPD Maternal Grandmother    Osteoporosis Maternal Aunt    Depression Maternal Aunt    Thyroid  disease Mother    Diabetes Mother    COPD Mother    Rheum arthritis Mother    Diverticulitis Father   [4] Social HistoryTobacco Use  Smoking status: Never  Smokeless tobacco: Never Substance Use Topics  Alcohol use: Yes   Comment: socially   Drug use: Yes   Types: Marijuana   Comment: occasional

## 2024-05-09 NOTE — Patient Instructions (Signed)
 You were seen here in the urgent care for your cold symptoms.  Your lung sounds are clear and there is no concern for pneumonia or bronchitis.  Your ear exam shows no sign of ear infection.  Your exam is reassuring and there is no sign of strep throat.  If you had a strep throat swab obtained it will be sent for culture and if anything comes back positive you will receive a phone call.  If you had any viral swabs obtained, your results are pending and you will receive a phone call or you can follow-up with the results on MyChart.  Continue Tylenol , Motrin , and over-the-counter cough and cold medications at home for symptom resolution.  I recommend staying hydrated and resting is much as possible.  You can return to work or school when you are 24 hours fever free and well enough to participate.  If your symptoms do not improve after 7 days I recommend returning for further evaluation for antibiotics.  Return with any further concerns.  Follow-up with your PCP.

## 2024-05-10 LAB — META/RHINO/ADENO VIRUS NAAT (PCR)
Adenovirus NAAT (PCR): NEGATIVE
Metapneumovirus NAAT (PCR): NEGATIVE
Rhinovirus NAAT (PCR): POSITIVE — AB

## 2024-05-11 LAB — COVID/INFLUENZA A & B/RSV NAAT (PCR)
Influenza B NAAT (PCR): NEGATIVE
Influenza B NAAT (PCR): NEGATIVE
RSV NAAT (PCR): NEGATIVE
RSV NAAT (PCR): NEGATIVE

## 2024-05-11 LAB — STREP A CULTURE, THROAT: Group A Strep Throat Culture: 0

## 2024-05-12 ENCOUNTER — Ambulatory Visit: Payer: Self-pay

## 2024-05-22 ENCOUNTER — Encounter: Payer: Self-pay | Admitting: Primary Care

## 2024-05-22 DIAGNOSIS — T50905A Adverse effect of unspecified drugs, medicaments and biological substances, initial encounter: Secondary | ICD-10-CM

## 2024-05-22 NOTE — Telephone Encounter (Signed)
Pt needs prior auth  for wegovy

## 2024-05-28 ENCOUNTER — Other Ambulatory Visit: Payer: Self-pay | Admitting: Primary Care

## 2024-05-29 NOTE — Telephone Encounter (Signed)
 This patient has not been seen since 09/06/23 with no followup scheduled, front end please call pt and schedule before refills can be sent

## 2024-05-29 NOTE — Telephone Encounter (Signed)
Pharmacy needs a new script for this

## 2024-05-30 ENCOUNTER — Encounter: Payer: Self-pay | Admitting: Primary Care

## 2024-06-01 MED ORDER — SEMAGLUTIDE-WEIGHT MANAGEMENT 2.4 MG/0.75ML SC SOAJ *A*
2.4000 mg | SUBCUTANEOUS | 1 refills | Status: AC
Start: 2024-06-01 — End: ?

## 2024-06-01 NOTE — Telephone Encounter (Signed)
 LVM for pt to return office call.

## 2024-06-01 NOTE — Telephone Encounter (Signed)
 Please contact patient and advise that I sent the Wegovy  to the [facility] in [place].  Please confirm that this is the pharmacy she needed to be sent to as she has 2 on file.

## 2024-06-05 ENCOUNTER — Other Ambulatory Visit: Payer: Self-pay

## 2024-06-06 ENCOUNTER — Telehealth: Payer: Self-pay | Admitting: Primary Care

## 2024-06-06 ENCOUNTER — Ambulatory Visit: Attending: Internal Medicine | Admitting: Internal Medicine

## 2024-06-06 ENCOUNTER — Encounter: Payer: Self-pay | Admitting: Internal Medicine

## 2024-06-06 VITALS — BP 108/62 | HR 84 | Temp 98.8°F | Ht 70.0 in | Wt 212.0 lb

## 2024-06-06 DIAGNOSIS — T50905A Adverse effect of unspecified drugs, medicaments and biological substances, initial encounter: Secondary | ICD-10-CM | POA: Insufficient documentation

## 2024-06-06 DIAGNOSIS — R634 Abnormal weight loss: Secondary | ICD-10-CM | POA: Insufficient documentation

## 2024-06-06 DIAGNOSIS — F419 Anxiety disorder, unspecified: Secondary | ICD-10-CM | POA: Insufficient documentation

## 2024-06-06 DIAGNOSIS — F909 Attention-deficit hyperactivity disorder, unspecified type: Secondary | ICD-10-CM | POA: Insufficient documentation

## 2024-06-06 DIAGNOSIS — E039 Hypothyroidism, unspecified: Secondary | ICD-10-CM | POA: Insufficient documentation

## 2024-06-06 NOTE — Progress Notes (Signed)
 [facility] Progress Note11/04/2025Reason For Visit: routine follow up HPI:Amanda Phelps is 33 y.o. presents qnm:Tzphyu loss Doing well on Wegovy  2.4 mg weekly.  States no associated GI side effects.Reports that she recently had a plateau in her weight due to recent viral illness, however she and her partner plan on  locking back in once they return from their [place]  trip.  Patient states her goal weight is 190 pounds.  She is currently at 212 pounds.  She started her weight loss journey and around 260.She continues on fluoxetine  60 mg for management of her anxiety.  States that medication has been beneficial and reports no associated side effects.  She denies any anhedonia, irritability, nervousness, or suicidal/homicidal ideation.She remains on levothyroxine  25 mcg for management of her hypothyroidism.  Most recent thyroid  panel back in March 2025 showed TSH at 2.25 and free T4 at 1.1.  Patient reports no associated skin, hair, or nail changes.  She reports no bowel or bladder dysfunction.  She reports no heat or cold intolerance.Patient also has a history of adult ADHD no current medication management.  States her symptoms are well-controlled at this time.Medications: Current Outpatient Medications Medication Sig  tacrolimus (PROTOPIC) 0.1 % ointment SMARTSIG:Topical Twice Daily  semaglutide  for weight management (WEGOVY ) 2.4 MG/0.75ML auto-injector Inject 2.4 mg into the skin every 7 days for Obesity.  levothyroxine  (SYNTHROID , LEVOTHROID) 25 mcg tablet TAKE 1 TABLET(25 MCG) BY MOUTH DAILY BEFORE BREAKFAST  albuterol  HFA (PROVENTIL , VENTOLIN ) 108 (90 Base) MCG/ACT inhaler Inhale 1-2 puffs into the lungs every 6 hours as needed for Wheezing. Shake well before each use.  mometasone  (ELOCON ) 0.1 % cream Apply topically daily. as a thin film to the following areas: affected areas  FLUoxetine  (PROZAC ) 20 mg capsule Take 1 capsule (20 mg total) by  mouth daily (Patient taking differently: Take 3 capsules (60 mg total) by mouth daily.)  levonorgestrel , MIRENA , (MIRENA , 52 MG,) 20 MCG/24HR IUD Mirena  20 mcg/24 hours (7 yrs) 52 mg intrauterine device Take by intrauterine route.  sodium chloride  (OCEAN) 0.65 % nasal spray Spray 1 spray into each nostril as needed.  fluticasone  (FLONASE ) 50 MCG/ACT nasal spray Spray 1 spray into each nostril daily for 10 days for Stuffy Nose.  valACYclovir  (VALTREX ) 1 gm tablet Take 1 tablet (1,000 mg total) by mouth 2 times daily. Past Medical History: Past Medical History: Diagnosis Date  ADD (attention deficit disorder)   not formerly diagnosed  Anxiety disorder   Hashimoto's disease   Patient's problem list, allergies, and medications were reviewed and updated as appropriate. Please see the EHR for full details.Vitals:  06/06/24 0805 BP: 108/62 BP Location: Left arm Patient Position: Sitting Cuff Size: large adult Pulse: 84 Temp: 37.1 C (98.8 F) TempSrc: Temporal SpO2: 97% Weight: 96.2 kg (212 lb) Height: 1.778 m (5' 10) Objective: Vital Signs. As abovePhysical ExamVitals reviewed. Constitutional:     General: She is not in acute distress.   Appearance: Normal appearance. She is not ill-appearing or toxic-appearing. Neck:    Thyroid : No thyroid  mass, thyromegaly or thyroid  tenderness. Cardiovascular:    Rate and Rhythm: Normal rate and regular rhythm.    Heart sounds: S1 normal and S2 normal. Pulmonary:    Breath sounds: No wheezing, rhonchi or rales. Neurological:    Mental Status: She is alert. Psychiatric:       Mood and Affect: Mood normal.       Behavior: Behavior normal. ASSESSMENT/PLAN: Weight loss due to medicationPatient with recent plateau of weight due  to viral illness.  She does have an upcoming trip planned to [place]  however she does plan on getting back on schedule after she returns back to [place].  Plan for continuation of Wegovy  2.4 mg weekly injections with goal of patient getting down to her desired weight of 190 pounds.  Recommend low carbohydrate/fat diet.  Recommended continuation of physical activity weekly.  Patient to follow-up in clinic in 3 monthsAnxiety disorder, unspecified type Well-managed with fluoxetine  60 mg daily.  No medication changes warranted at this time.Adult ADHDControlled without medication management.  Continue monitoringHypothyroidism, unspecified typeNo evidence of thyromegaly or tenderness noted on physical examination.Continue levothyroxine  25 mcg daily.AtFollow up: Follow up in about 3 months (around 09/06/2024), or if symptoms worsen or fail to improve.Future Appointments     Provider Department Center  10/11/2024 10:00 AM Ramona Pump, [facility]    Author: Nat CHRISTELLA Mt, NPThis note was dictated using Dragon voice recognition software. A reasonable effort was made to proofread this note but there may be minor transcription/typographical errors.

## 2024-06-06 NOTE — Telephone Encounter (Signed)
 Follow-up disposition: Follow up in about 3 months (around 09/06/2024).

## 2024-06-07 NOTE — Telephone Encounter (Signed)
 Lmom to schedule 3 month fuv around 09/06/2024

## 2024-06-09 NOTE — Telephone Encounter (Signed)
 Another message left

## 2024-06-14 ENCOUNTER — Encounter: Payer: Self-pay | Admitting: Internal Medicine

## 2024-06-14 NOTE — Telephone Encounter (Addendum)
 Lmom to return call and varify provider she wants to see, and set fuv up/ letter sent

## 2024-06-15 ENCOUNTER — Encounter: Payer: Self-pay | Admitting: Internal Medicine

## 2024-06-15 NOTE — Telephone Encounter (Signed)
Lmom - letter sent

## 2024-07-07 ENCOUNTER — Telehealth: Payer: Self-pay | Admitting: Family Medicine

## 2024-07-07 NOTE — Telephone Encounter (Signed)
 Patient has been scheduled with Dr. Danny for FUV in 15 mins on 07/10/24. She is a former [facility] patient. Per Nat Mt Follow up in about 3 months (around 09/06/2024), or if symptoms worsen or fail to improve. From 06/06/24 office visit. I have called and left a detailed message letting patient know to call back to reschedule. This would need to be in a 45 min (establish care) FUV.

## 2024-07-10 ENCOUNTER — Ambulatory Visit: Admitting: Family Medicine

## 2024-07-10 NOTE — Telephone Encounter (Signed)
 Patient rescheduled and aware of Future Encounters    08/31/2024  8:00 AM [facility]  FOLLOW UP VISIT  Authorization not required  [employer], Joy, DO

## 2024-07-12 ENCOUNTER — Telehealth: Payer: Self-pay

## 2024-07-12 ENCOUNTER — Encounter: Payer: Self-pay | Admitting: Internal Medicine

## 2024-07-12 NOTE — Telephone Encounter (Signed)
 Approved for the Wegovy  she is all set

## 2024-07-12 NOTE — Telephone Encounter (Signed)
 Patient is aware.

## 2024-07-12 NOTE — Telephone Encounter (Signed)
 Request for wegovy  in pt adviceSarah Phelps no pool attached

## 2024-07-12 NOTE — Telephone Encounter (Signed)
 Can we please get auth on the Wegovy .

## 2024-07-29 ENCOUNTER — Ambulatory Visit

## 2024-07-29 VITALS — BP 128/76 | HR 82 | Temp 97.8°F | Resp 20

## 2024-07-29 DIAGNOSIS — N760 Acute vaginitis: Secondary | ICD-10-CM | POA: Insufficient documentation

## 2024-07-29 DIAGNOSIS — N390 Urinary tract infection, site not specified: Secondary | ICD-10-CM | POA: Insufficient documentation

## 2024-07-29 LAB — POCT CLINITEK URINALYSIS
Bilirubin, Clinitek POCT UA: NEGATIVE
Blood, Clinitek POCT UA: NEGATIVE
Glucose, Clinitek POCT UA: NEGATIVE mg/dL
Ketone, Clinitek POCT UA: NEGATIVE mg/dL
Lot #: 504033
Nitrite, Clinitek POCT UA: NEGATIVE
Protein, Clinitek POCT UA: NEGATIVE mg/dL
Specific Gravity, Clinitek POCT UA: 1.025 (ref 1.001–1.035)
Urobilinogen, Clinitek POCT UA: 0.2 U/dL (ref 0.2–1.0)
pH, Clinitek POCT US: 5.5 (ref 4.6–8.0)

## 2024-07-29 LAB — POCT URINE PREGNANCY
Lot #: 241074
Preg Test,UR POC: NEGATIVE

## 2024-07-29 MED ORDER — CEPHALEXIN 500 MG PO CAPS *I*: 500.0000 mg | ORAL_CAPSULE | Freq: Four times a day (QID) | ORAL | 0 refills | Status: AC

## 2024-07-29 NOTE — Patient Instructions (Signed)
 Today you are seen in the urgent care for concern of bilateral flank pain, urinary urgency, frequency, burning with urination.  Based on history, physical exam and your urine tests it is likely that you have a urinary tract infection.  I have sent to the pharmacy Keflex , I recommend you take this antibiotic as prescribed in its entirety.  If there is any change indicated in your antibiotic therapy you will be contacted.  I recommend continuing with supportive management.  We have obtained a vaginal swab as well.  If this comes back positive for yeast, BV or Trichomonas you will be contacted and started on appropriate management.  Continue to monitor symptoms.  If you develop any worsening symptoms, fever, abdominal pain, vomiting, worsening pain in your back I recommend you return to the emergency department for further evaluation.  Follow-up with your primary care provider as needed.  Return to the urgent care for any other concern.

## 2024-07-29 NOTE — UC Provider Note (Signed)
 History Chief Complaint Patient presents with  Vaginitis 33 year old female presenting to [facility] urgent care for concern of burning with urination, vaginal burning, increased urinary frequency, urinary urgency and flank pain.  Patient endorses symptoms have been ongoing over the past 3 days.  She denies any fever, abdominal pain, nausea, vomiting, diarrhea or constipation.  Patient endorses that she is concerned she may have a yeast infection as she has been on doxycycline  for approximately a month and a half.  She denies any vaginal discharge.  Patient endorses she has a vaginal irritation feeling.  Patient endorses that her back pain is persistent.  She does not endorse a back pain wrapping around to her front.  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 HENT:    Head: Normocephalic and atraumatic. Cardiovascular:    Rate and Rhythm: Normal rate and regular rhythm.    Pulses: Normal pulses.    Heart sounds: Normal heart sounds. Pulmonary:    Effort: Pulmonary effort is normal.    Breath sounds: Normal breath sounds. Abdominal:    General: Abdomen is flat. Bowel sounds  are normal. There is no distension.    Palpations: Abdomen is soft. There is no mass.    Tenderness: There is no abdominal tenderness. There is right CVA tenderness and left CVA tenderness. There is no guarding or rebound.    Hernia: No hernia is present. Genitourinary:   Comments: Patient declinedNeurological:    Mental Status: She is alert. Psychiatric:       Mood and Affect: Mood normal.       Behavior: Behavior normal.       Thought Content: Thought content normal.       Judgment: Judgment normal.  Medical Decision Making Medical Decision MakingAssessment:  33 year old female presenting to [facility] urgent care for concern of urinary urgency, frequency, dysuria.  Vital signs and physical exam are reassuring.Differential diagnosis:  Acute cystitis, pyelonephritis, overactive bladder, interstitial cystitis, STI , yeast, BVPlan and Results:  Today you are seen in the urgent care for concern of bilateral flank pain, urinary urgency, frequency, burning with urination.  Based on history, physical exam and your urine tests it is likely that you have a urinary tract infection.  I have sent to the pharmacy Keflex , I recommend you take this antibiotic as prescribed in its entirety.  If there is any change indicated in your antibiotic therapy you will be contacted.  I recommend continuing with supportive management.  We have obtained a vaginal swab as well.  If this comes back positive for yeast, BV or Trichomonas you will be contacted and started on appropriate management.  Continue to monitor symptoms.  If you develop any worsening symptoms, fever, abdominal pain, vomiting, worsening pain in your back I recommend you return to the emergency department for further evaluation.  Follow-up with your primary care provider as needed.  Return to the urgent care for any other concern.Patient does have trace leuks POCT Clinitek urinalysis.  Patient does have urinary tract  infection-like symptoms with urinary urgency, frequency, dysuria.  Patient also has bilateral CVA tenderness noted on exam.  Will treat with regimen of Keflex  for this.  I recommend to patient to go to the emergency department if she develops any worsening symptoms.Lab results Recent Results (from the past 24 hours)-POCT Clinitek Urinalysis: Collection Time: 07/29/24  4:14 PM     Result                                            Value                                                  Urine Color, POCT UA                              Yellow                                                 Urine Appearance, POCT UA                         Clear                                                  Glucose, Clinitek POCT UA                         Negative                                               Bilirubin, Clinitek POCT UA                       Negative                                               Ketone, Clinitek POCT UA                          Negative                                               Specific Gravity, Clinitek POCT UA                1.025  Blood, Clinitek POCT UA                           Negative                                               pH, Clinitek POCT US                               5.5                                                    Protein, Clinitek POCT UA                         Negative                                               Urobilinogen, Clinitek POCT UA                    0.2                                                    Nitrite, Clinitek POCT UA                         Negative                                               Leukocytes, Clinitek POCT UA                      Trace (!)                                              Lot #                                             [id number]                                                 Exp date                                           06/02/2025                                        -  POCT urine pregnancy: Collection Time: 07/29/24  4:14 PM     Result                                            Value                                                  Preg Test,UR POC                                  Negative                                               INTERNAL CONTROL POCT URINE PREGNANCY             *Yes-internal procedural control(s) acceptable         Exp date                                          02/14/2025                                             Lot #                                             [id number]                                            Diagnosis and Disposition: Urinary tract infection.  Discharge home.Final Diagnosis  ICD-10-CM ICD-9-CM 1. Vaginitis  N76.0 616.10 Damien Mo, PAAuthor:  Damien Mo, PA [1] Past Medical History:Diagnosis Date  ADD (attention deficit disorder)   not formerly diagnosed  Anxiety disorder   Hashimoto's disease  [2] Past Surgical History:Procedure Laterality Date  TONSILLECTOMY AND ADENOIDECTOMY   [3] Family HistoryProblem Relation Name Age of Onset  Cancer Maternal Grandfather        Lung cancer, smoker  Heart failure Maternal Grandfather    Thyroid  disease Maternal Grandfather    Heart failure Maternal Grandmother    Diabetes Maternal Grandmother    Anemia Maternal Grandmother    COPD Maternal Grandmother    Osteoporosis Maternal Aunt    Depression Maternal Aunt    Thyroid  disease Mother    Diabetes Mother    COPD Mother    Rheum arthritis Mother    Diverticulitis Father   [4] Social HistoryTobacco Use  Smoking status: Never  Smokeless tobacco: Never Substance Use Topics  Alcohol use: Yes   Comment: socially   Drug use: Yes   Types: Marijuana   Comment: occasional

## 2024-07-29 NOTE — Nursing Note (Signed)
 Patient presents to uc with vaginal yeast concerns from a long term antibiotic.Patient has some pain in stomach and lower back as well.

## 2024-07-30 LAB — VAGINITIS SCREEN: DNA PROBE: Vaginitis Screen:DNA Probe: 0

## 2024-07-31 LAB — AEROBIC BACTERIAL URINE CULTURE: Aerobic bacterial urine culture: 0

## 2024-08-30 ENCOUNTER — Telehealth: Payer: Self-pay | Admitting: Internal Medicine

## 2024-08-30 NOTE — Telephone Encounter (Signed)
 Patient calling states she is in the process of moving. She has canceled her npv with Dr. Danny tomorrow She may be getting a new PCP in the city she is moving to. Thank you

## 2024-08-30 NOTE — Telephone Encounter (Signed)
 Noted.

## 2024-08-31 ENCOUNTER — Ambulatory Visit: Payer: Self-pay | Admitting: Family Medicine

## 2024-10-11 ENCOUNTER — Ambulatory Visit
